# Patient Record
Sex: Female | Born: 1980 | Race: White | Hispanic: No | Marital: Single | Smoking: Current some day smoker
Health system: Southern US, Community
[De-identification: ages and names within clinical notes are randomized; demographics above are authoritative.]

## PROBLEM LIST (undated history)

## (undated) DIAGNOSIS — K611 Rectal abscess: Secondary | ICD-10-CM

## (undated) DIAGNOSIS — K6289 Other specified diseases of anus and rectum: Secondary | ICD-10-CM

## (undated) HISTORY — PX: JOINT REPLACEMENT: SHX530

## (undated) HISTORY — DX: Rectal abscess: K61.1

## (undated) HISTORY — DX: Other specified diseases of anus and rectum: K62.89

---

## 2000-02-08 ENCOUNTER — Encounter: Admission: RE | Admit: 2000-02-08 | Discharge: 2000-02-08 | Payer: Self-pay | Admitting: Family Medicine

## 2000-02-08 ENCOUNTER — Encounter: Payer: Self-pay | Admitting: Family Medicine

## 2000-03-19 ENCOUNTER — Other Ambulatory Visit: Admission: RE | Admit: 2000-03-19 | Discharge: 2000-03-19 | Payer: Self-pay | Admitting: Gynecology

## 2000-03-19 ENCOUNTER — Encounter (INDEPENDENT_AMBULATORY_CARE_PROVIDER_SITE_OTHER): Payer: Self-pay | Admitting: Specialist

## 2001-02-18 ENCOUNTER — Other Ambulatory Visit: Admission: RE | Admit: 2001-02-18 | Discharge: 2001-02-18 | Payer: Self-pay | Admitting: Gynecology

## 2001-11-25 ENCOUNTER — Other Ambulatory Visit: Admission: RE | Admit: 2001-11-25 | Discharge: 2001-11-25 | Payer: Self-pay | Admitting: Gynecology

## 2002-06-23 ENCOUNTER — Other Ambulatory Visit: Admission: RE | Admit: 2002-06-23 | Discharge: 2002-06-23 | Payer: Self-pay | Admitting: Gynecology

## 2002-10-23 ENCOUNTER — Encounter: Payer: Self-pay | Admitting: Family Medicine

## 2002-10-23 ENCOUNTER — Encounter: Admission: RE | Admit: 2002-10-23 | Discharge: 2002-10-23 | Payer: Self-pay | Admitting: Family Medicine

## 2002-12-30 ENCOUNTER — Other Ambulatory Visit: Admission: RE | Admit: 2002-12-30 | Discharge: 2002-12-30 | Payer: Self-pay | Admitting: Gynecology

## 2003-08-10 ENCOUNTER — Other Ambulatory Visit: Admission: RE | Admit: 2003-08-10 | Discharge: 2003-08-10 | Payer: Self-pay | Admitting: Gynecology

## 2004-01-05 ENCOUNTER — Encounter: Admission: RE | Admit: 2004-01-05 | Discharge: 2004-01-05 | Payer: Self-pay | Admitting: Family Medicine

## 2004-01-25 ENCOUNTER — Other Ambulatory Visit: Admission: RE | Admit: 2004-01-25 | Discharge: 2004-01-25 | Payer: Self-pay | Admitting: Gynecology

## 2004-08-02 ENCOUNTER — Encounter: Admission: RE | Admit: 2004-08-02 | Discharge: 2004-08-02 | Payer: Self-pay | Admitting: Family Medicine

## 2004-08-23 ENCOUNTER — Other Ambulatory Visit: Admission: RE | Admit: 2004-08-23 | Discharge: 2004-08-23 | Payer: Self-pay | Admitting: Gynecology

## 2005-10-31 ENCOUNTER — Other Ambulatory Visit: Admission: RE | Admit: 2005-10-31 | Discharge: 2005-10-31 | Payer: Self-pay | Admitting: Gynecology

## 2006-02-15 ENCOUNTER — Inpatient Hospital Stay (HOSPITAL_COMMUNITY): Admission: AD | Admit: 2006-02-15 | Discharge: 2006-02-15 | Payer: Self-pay | Admitting: Obstetrics and Gynecology

## 2006-02-26 ENCOUNTER — Inpatient Hospital Stay (HOSPITAL_COMMUNITY): Admission: AD | Admit: 2006-02-26 | Discharge: 2006-02-26 | Payer: Self-pay | Admitting: Obstetrics & Gynecology

## 2006-03-12 ENCOUNTER — Inpatient Hospital Stay (HOSPITAL_COMMUNITY): Admission: RE | Admit: 2006-03-12 | Discharge: 2006-03-12 | Payer: Self-pay | Admitting: Obstetrics & Gynecology

## 2006-03-22 ENCOUNTER — Inpatient Hospital Stay (HOSPITAL_COMMUNITY): Admission: AD | Admit: 2006-03-22 | Discharge: 2006-03-23 | Payer: Self-pay | Admitting: Family Medicine

## 2006-10-18 ENCOUNTER — Inpatient Hospital Stay (HOSPITAL_COMMUNITY): Admission: AD | Admit: 2006-10-18 | Discharge: 2006-10-18 | Payer: Self-pay | Admitting: Obstetrics and Gynecology

## 2006-10-26 ENCOUNTER — Inpatient Hospital Stay (HOSPITAL_COMMUNITY): Admission: AD | Admit: 2006-10-26 | Discharge: 2006-10-26 | Payer: Self-pay | Admitting: Obstetrics and Gynecology

## 2006-10-27 ENCOUNTER — Inpatient Hospital Stay (HOSPITAL_COMMUNITY): Admission: AD | Admit: 2006-10-27 | Discharge: 2006-10-29 | Payer: Self-pay | Admitting: Obstetrics and Gynecology

## 2007-06-13 ENCOUNTER — Ambulatory Visit: Payer: Self-pay | Admitting: Internal Medicine

## 2007-06-18 ENCOUNTER — Ambulatory Visit: Payer: Self-pay | Admitting: Internal Medicine

## 2007-06-19 ENCOUNTER — Ambulatory Visit: Payer: Self-pay | Admitting: *Deleted

## 2007-06-20 ENCOUNTER — Ambulatory Visit (HOSPITAL_COMMUNITY): Admission: RE | Admit: 2007-06-20 | Discharge: 2007-06-20 | Payer: Self-pay | Admitting: Family Medicine

## 2007-06-27 ENCOUNTER — Encounter: Admission: RE | Admit: 2007-06-27 | Discharge: 2007-09-25 | Payer: Self-pay | Admitting: Family Medicine

## 2007-07-02 ENCOUNTER — Encounter: Payer: Self-pay | Admitting: Family Medicine

## 2007-07-02 ENCOUNTER — Ambulatory Visit: Payer: Self-pay | Admitting: Internal Medicine

## 2007-07-02 LAB — CONVERTED CEMR LAB
Basophils Absolute: 0 10*3/uL (ref 0.0–0.1)
Eosinophils Absolute: 0.3 10*3/uL (ref 0.2–0.7)
HCT: 44.3 % (ref 36.0–46.0)
Lymphs Abs: 3 10*3/uL (ref 0.7–4.0)
Monocytes Relative: 6 % (ref 3–12)
Neutro Abs: 5.3 10*3/uL (ref 1.7–7.7)
Platelets: 240 10*3/uL (ref 150–400)
RDW: 13.5 % (ref 11.5–15.5)
WBC: 9.2 10*3/uL (ref 4.0–10.5)

## 2007-07-03 ENCOUNTER — Ambulatory Visit (HOSPITAL_COMMUNITY): Admission: RE | Admit: 2007-07-03 | Discharge: 2007-07-03 | Payer: Self-pay | Admitting: Family Medicine

## 2007-07-12 ENCOUNTER — Ambulatory Visit: Payer: Self-pay | Admitting: Internal Medicine

## 2007-08-12 ENCOUNTER — Ambulatory Visit: Payer: Self-pay | Admitting: Family Medicine

## 2007-10-31 IMAGING — US US OB COMP LESS 14 WK
1 series · 14 of 28 positions shown · non-contrast
Comparison: none

CLINICAL DATA: 6 weeks pregnant with sharp abdominal pains.  
OBSTETRICAL ULTRASOUND <14 WKS AND TRANSVAGINAL OB US:
Intrauterine gestational sac:   Yes
Yolk sac:  Yes
Embryo:  No
Heart Beat:  No

MSD:  1.2 mm    6 w 0d
No subchorionic hemorrhage:
Maternal Uterine and Adnexal findings:
Normal ovaries.  No free fluid.

[Series 1: us ob comp less 14 wk · 0.29mm/px · 14 of 48 slices shown]
[im 2/48]
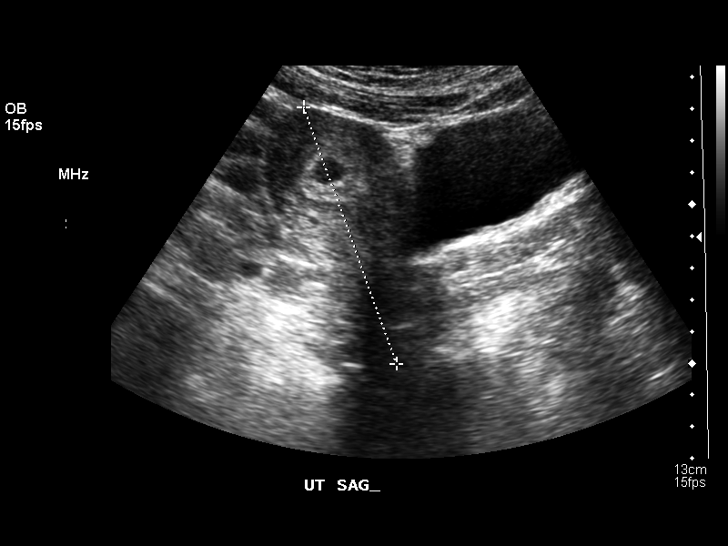
[im 6/48]
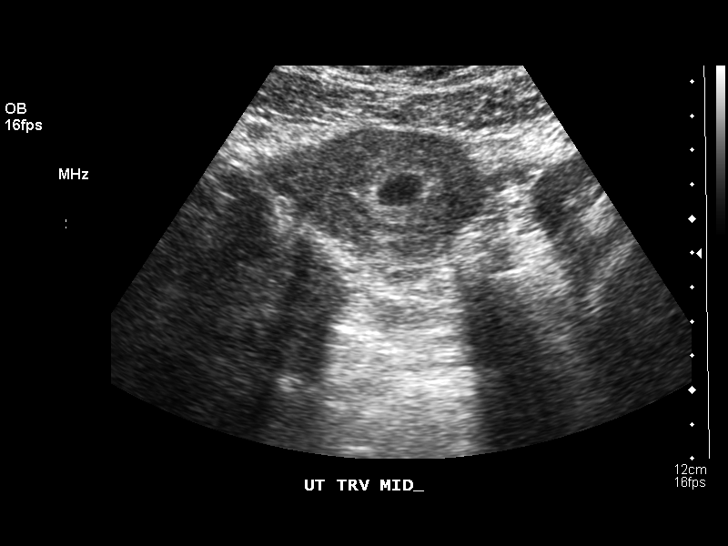
[im 9/48]
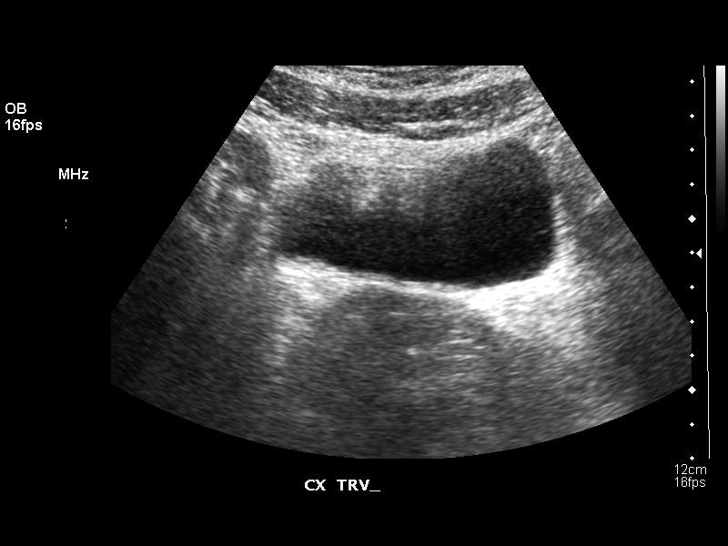
[im 13/48]
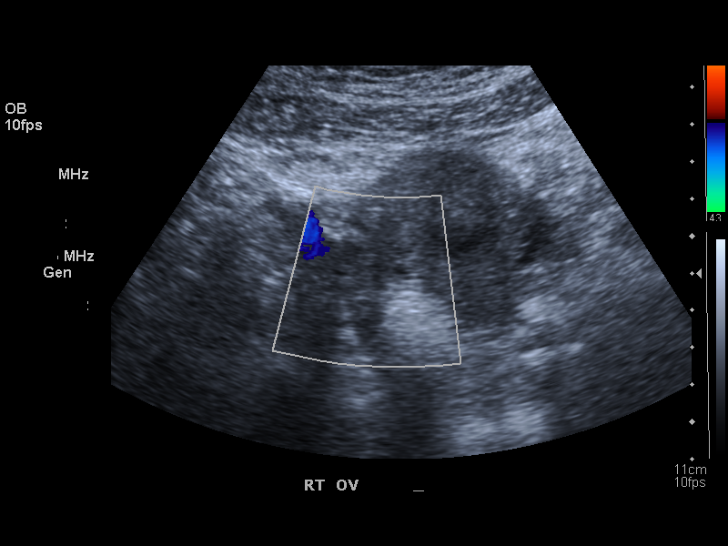
[im 16/48]
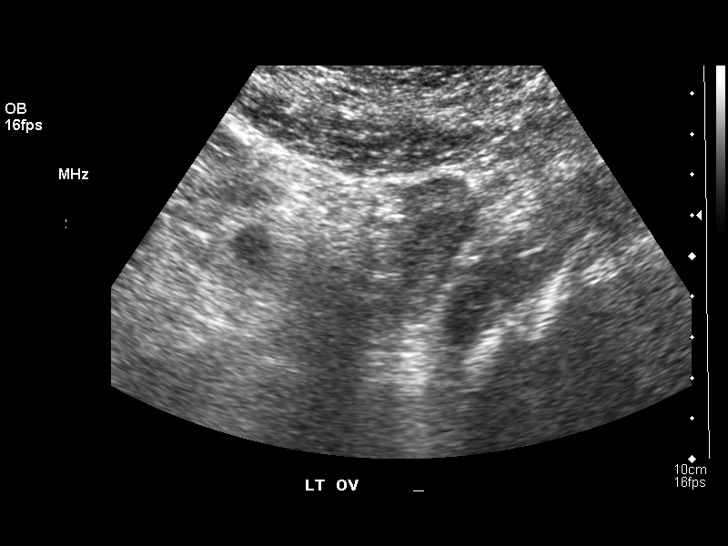
[im 20/48]
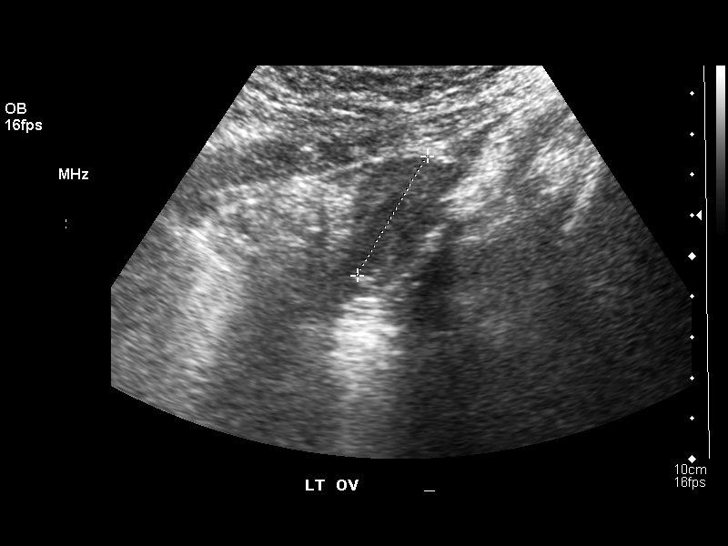
[im 23/48]
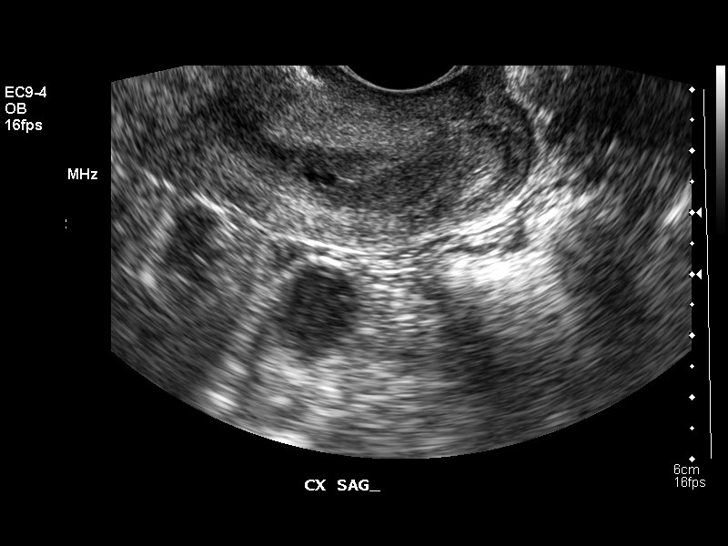
[im 27/48]
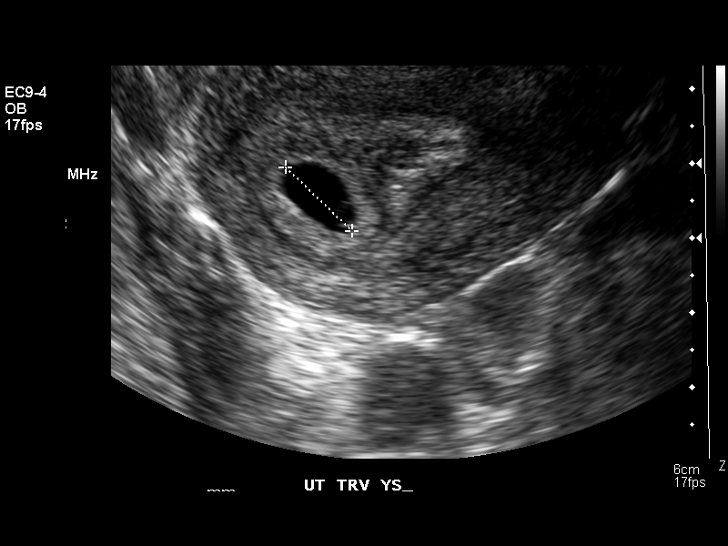
[im 30/48]
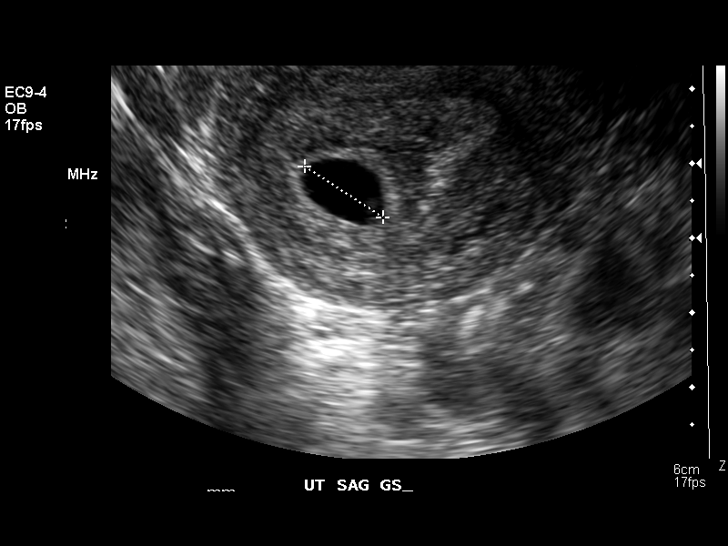
[im 34/48]
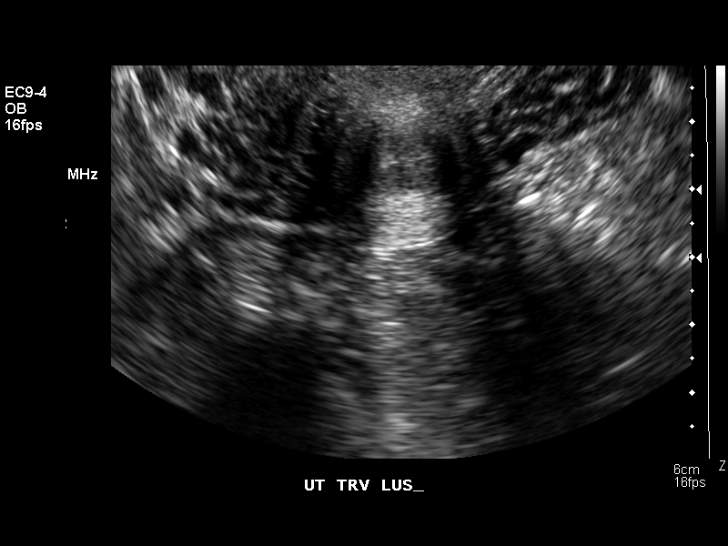
[im 37/48]
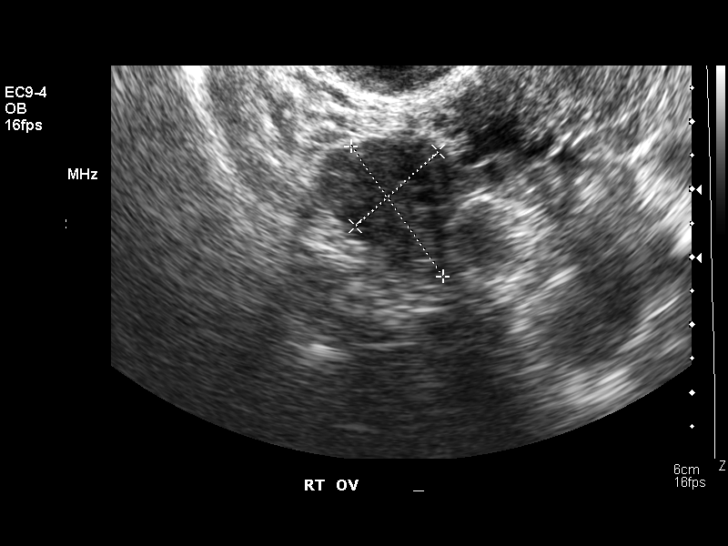
[im 41/48]
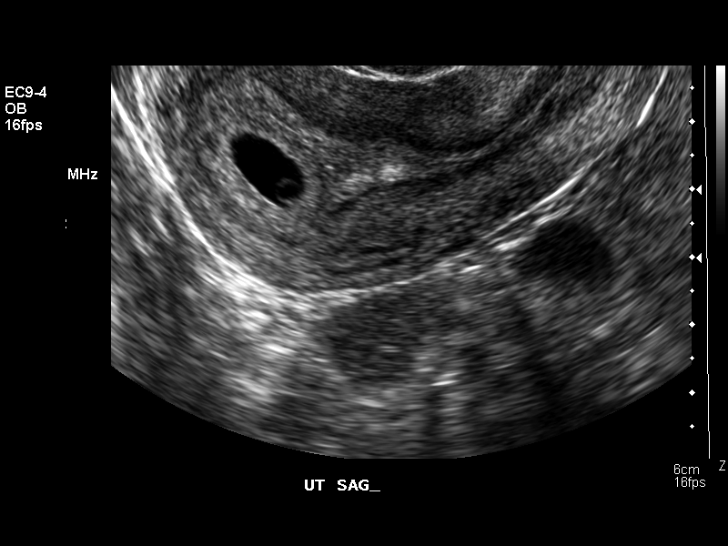
[im 44/48]
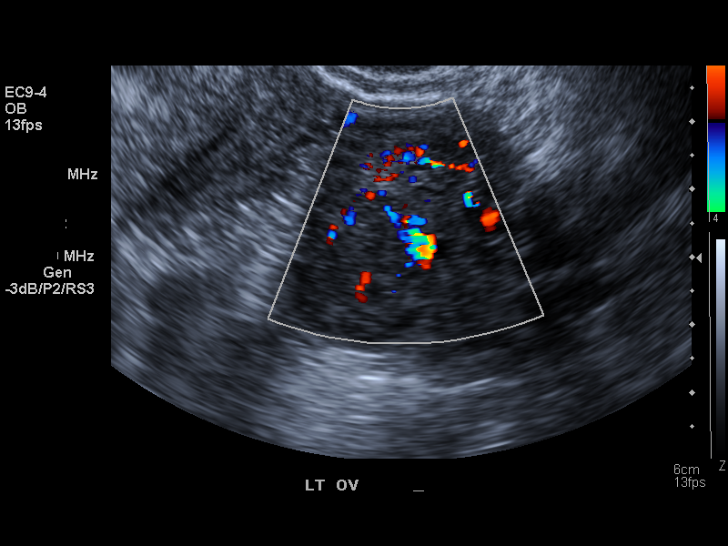
[im 48/48]
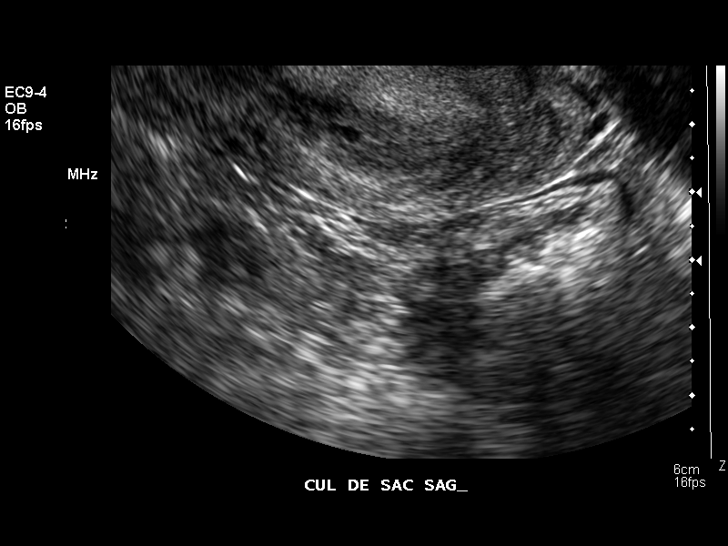

[14 of 28 positions shown; findings below may reference images not displayed]

IMPRESSION: Early intrauterine pregnancy with gestational age of 6 weeks 0 days.

## 2009-11-08 ENCOUNTER — Encounter: Admission: RE | Admit: 2009-11-08 | Discharge: 2009-12-08 | Payer: Self-pay | Admitting: Family Medicine

## 2009-11-30 ENCOUNTER — Emergency Department (HOSPITAL_COMMUNITY): Admission: EM | Admit: 2009-11-30 | Discharge: 2009-11-30 | Payer: Self-pay | Admitting: Family Medicine

## 2011-01-06 NOTE — Discharge Summary (Signed)
NAMEANGELL, PINCOCK NO.:  [id number]   MEDICAL RECORD NO.:  [id number]          PATIENT TYPE:  INP   LOCATION:  9148                          FACILITY:  [facility]   PHYSICIAN:  Huel Cote, M.D. DATE OF BIRTH:  1981/07/16   DATE OF ADMISSION:  10/27/2006  DATE OF DISCHARGE:  10/29/2006                               DISCHARGE SUMMARY   DISCHARGE DIAGNOSES:  1. Term pregnancy at 40-3/7 weeks delivered.  2. Status post normal spontaneous vaginal delivery.   DISCHARGE MEDICATIONS:  1. Motrin 600 mg p.o. every 6 hours.  2. Prenatal vitamins one p.o. daily   DISCHARGE FOLLOWUP:  The patient is to follow-up in the office in 6  weeks for her routine postpartum exam.  She does request that she get  Implanon put in at her 6-week postpartum exam and will likely make that  appointment with Dr. Lavina Hamman.   LABORATORY DATA:  Prenatal labs are as follows: O+, antibody negative,  RPR nonreactive, hepatitis B surface antigen negative, rubella immune,  GC negative, chlamydia negative, 1-hour Glucola 97, group B Strep  positive.   OBSTETRICAL HISTORY:  She had one elective abortion.  Past GYN:  History  of abnormal Pap smears which resolved.   PAST SURGICAL HISTORY:  No surgical history.   PAST MEDICAL HISTORY:  No medical history.   ALLERGIES:  No allergies.   HOSPITAL COURSE:  On admission she was afebrile with stable vital signs.  She had presented to maternity admissions with a cervical exam showing 5  cm of dilation and was admitted with spontaneous rupture of membranes  several hours later.  She then continued to progress to complete  dilation and reached complete dilation and pushed well with normal  spontaneous vaginal delivery of a viable female infant over a first-  degree laceration.  Apgar's were 8 and 9, weight was 6 pounds, 12  ounces.  There was nuchal cord times one which was reduced.  Placenta  delivered spontaneously.  A first-degree laceration  was repaired with 2-  0 Vicryl for hemostasis.  Estimated blood loss was 350  mL.  Cervix and rectum were intact.  She was then admitted for routine  postpartum care.  She did quite well and by postpartum day #2 had no  complaints.  She had normal lochia.  Her fundus was firm and she was  felt stable for discharge home.  Her discharge hemoglobin was 11.4 down  from 13.9.      Huel Cote, M.D.  Electronically Signed     KR/MEDQ  D:  10/29/2006  T:  10/29/2006  Job:  [id number]

## 2012-01-20 ENCOUNTER — Encounter (HOSPITAL_COMMUNITY): Payer: Self-pay | Admitting: Anesthesiology

## 2012-01-20 ENCOUNTER — Encounter (HOSPITAL_COMMUNITY): Admission: EM | Disposition: A | Discharge status: Home / Self Care | Payer: Self-pay | Source: Home / Self Care

## 2012-01-20 ENCOUNTER — Inpatient Hospital Stay (HOSPITAL_COMMUNITY)
Admission: EM | Admit: 2012-01-20 | Discharge: 2012-01-23 | DRG: 348 | Disposition: A | Discharge status: Home / Self Care | Payer: Medicaid Other | Attending: General Surgery | Admitting: General Surgery

## 2012-01-20 ENCOUNTER — Encounter (HOSPITAL_COMMUNITY): Payer: Self-pay

## 2012-01-20 ENCOUNTER — Emergency Department (HOSPITAL_COMMUNITY): Payer: Medicaid Other | Admitting: Anesthesiology

## 2012-01-20 ENCOUNTER — Emergency Department (HOSPITAL_COMMUNITY): Payer: Medicaid Other

## 2012-01-20 DIAGNOSIS — K612 Anorectal abscess: Secondary | ICD-10-CM

## 2012-01-20 DIAGNOSIS — D72829 Elevated white blood cell count, unspecified: Secondary | ICD-10-CM | POA: Diagnosis present

## 2012-01-20 DIAGNOSIS — K610 Anal abscess: Secondary | ICD-10-CM

## 2012-01-20 DIAGNOSIS — Z79899 Other long term (current) drug therapy: Secondary | ICD-10-CM

## 2012-01-20 DIAGNOSIS — Z6841 Body Mass Index (BMI) 40.0 and over, adult: Secondary | ICD-10-CM

## 2012-01-20 DIAGNOSIS — K611 Rectal abscess: Secondary | ICD-10-CM

## 2012-01-20 DIAGNOSIS — N83209 Unspecified ovarian cyst, unspecified side: Secondary | ICD-10-CM | POA: Diagnosis present

## 2012-01-20 HISTORY — PX: INCISION AND DRAINAGE PERIRECTAL ABSCESS: SHX1804

## 2012-01-20 LAB — POCT I-STAT, CHEM 8
BUN: 9 mg/dL (ref 6–23)
Calcium, Ion: 1.16 mmol/L (ref 1.12–1.32)
Hemoglobin: 14.3 g/dL (ref 12.0–15.0)
TCO2: 24 mmol/L (ref 0–100)

## 2012-01-20 LAB — CBC
HCT: 40.1 % (ref 36.0–46.0)
Hemoglobin: 13.8 g/dL (ref 12.0–15.0)
MCHC: 34.4 g/dL (ref 30.0–36.0)
MCV: 87 fL (ref 78.0–100.0)
RBC: 4.61 MIL/uL (ref 3.87–5.11)
RDW: 13.2 % (ref 11.5–15.5)
WBC: 16.8 10*3/uL — ABNORMAL HIGH (ref 4.0–10.5)

## 2012-01-20 LAB — URINE MICROSCOPIC-ADD ON

## 2012-01-20 LAB — URINALYSIS, ROUTINE W REFLEX MICROSCOPIC
Ketones, ur: NEGATIVE mg/dL
Leukocytes, UA: NEGATIVE
Nitrite: NEGATIVE
Protein, ur: NEGATIVE mg/dL

## 2012-01-20 LAB — POCT PREGNANCY, URINE: Preg Test, Ur: NEGATIVE

## 2012-01-20 LAB — DIFFERENTIAL
Basophils Absolute: 0 10*3/uL (ref 0.0–0.1)
Eosinophils Absolute: 0.1 10*3/uL (ref 0.0–0.7)
Monocytes Absolute: 0.9 10*3/uL (ref 0.1–1.0)
Neutro Abs: 14.4 10*3/uL — ABNORMAL HIGH (ref 1.7–7.7)

## 2012-01-20 SURGERY — INCISION AND DRAINAGE, ABSCESS, PERIRECTAL
Anesthesia: General | Site: Rectum | Wound class: Dirty or Infected

## 2012-01-20 MED ORDER — MORPHINE SULFATE 4 MG/ML IJ SOLN: 4.0000 mg | Freq: Once | INTRAMUSCULAR | Status: DC

## 2012-01-20 MED ORDER — OXYCODONE-ACETAMINOPHEN 5-325 MG PO TABS
2.0000 | ORAL_TABLET | Freq: Once | ORAL | Status: AC
  Administered 2012-01-20: 2 via ORAL
  Filled 2012-01-20: qty 2

## 2012-01-20 MED ORDER — SODIUM CHLORIDE BACTERIOSTATIC 0.9 % IJ SOLN
INTRAMUSCULAR | Status: DC | PRN
  Administered 2012-01-20: 10 mL

## 2012-01-20 MED ORDER — DEXTROSE 5 % IV SOLN
INTRAVENOUS | Status: DC | PRN
  Administered 2012-01-20: 22:00:00 via INTRAVENOUS

## 2012-01-20 MED ORDER — ONDANSETRON HCL 4 MG/2ML IJ SOLN
4.0000 mg | INTRAMUSCULAR | Status: DC | PRN
  Administered 2012-01-20: 4 mg via INTRAVENOUS
  Filled 2012-01-20: qty 2

## 2012-01-20 MED ORDER — IOHEXOL 300 MG/ML  SOLN
100.0000 mL | Freq: Once | INTRAMUSCULAR | Status: AC | PRN
  Administered 2012-01-20: 100 mL via INTRAVENOUS

## 2012-01-20 MED ORDER — NEOSTIGMINE METHYLSULFATE 1 MG/ML IJ SOLN
INTRAMUSCULAR | Status: DC | PRN
  Administered 2012-01-20: 5 mg via INTRAVENOUS

## 2012-01-20 MED ORDER — PROPOFOL 10 MG/ML IV EMUL
INTRAVENOUS | Status: DC | PRN
  Administered 2012-01-20: 170 mg via INTRAVENOUS

## 2012-01-20 MED ORDER — MORPHINE SULFATE 4 MG/ML IJ SOLN
4.0000 mg | Freq: Once | INTRAMUSCULAR | Status: AC
  Administered 2012-01-20: 4 mg via INTRAVENOUS
  Filled 2012-01-20: qty 1

## 2012-01-20 MED ORDER — SODIUM CHLORIDE 0.9 % IV BOLUS (SEPSIS)
1000.0000 mL | Freq: Once | INTRAVENOUS | Status: AC
  Administered 2012-01-20: 1000 mL via INTRAVENOUS

## 2012-01-20 MED ORDER — LIDOCAINE HCL (CARDIAC) 20 MG/ML IV SOLN
INTRAVENOUS | Status: DC | PRN
  Administered 2012-01-20: 80 mg via INTRAVENOUS

## 2012-01-20 MED ORDER — SODIUM CHLORIDE 0.9 % IV SOLN
INTRAVENOUS | Status: DC | PRN
  Administered 2012-01-20: 22:00:00 via INTRAVENOUS

## 2012-01-20 MED ORDER — LACTATED RINGERS IV SOLN
INTRAVENOUS | Status: DC | PRN
  Administered 2012-01-20: 22:00:00 via INTRAVENOUS

## 2012-01-20 MED ORDER — ONDANSETRON HCL 4 MG/2ML IJ SOLN
INTRAMUSCULAR | Status: DC | PRN
  Administered 2012-01-20: 4 mg via INTRAVENOUS

## 2012-01-20 MED ORDER — 0.9 % SODIUM CHLORIDE (POUR BTL) OPTIME
TOPICAL | Status: DC | PRN
  Administered 2012-01-20: 1000 mL

## 2012-01-20 MED ORDER — SUCCINYLCHOLINE CHLORIDE 20 MG/ML IJ SOLN
INTRAMUSCULAR | Status: DC | PRN
  Administered 2012-01-20: 100 mg via INTRAVENOUS

## 2012-01-20 MED ORDER — GLYCOPYRROLATE 0.2 MG/ML IJ SOLN
INTRAMUSCULAR | Status: DC | PRN
  Administered 2012-01-20: .6 mg via INTRAVENOUS

## 2012-01-20 MED ORDER — FENTANYL CITRATE 0.05 MG/ML IJ SOLN
INTRAMUSCULAR | Status: DC | PRN
  Administered 2012-01-20 (×2): 100 ug via INTRAVENOUS

## 2012-01-20 MED ORDER — CEFAZOLIN SODIUM 1-5 GM-% IV SOLN
INTRAVENOUS | Status: DC | PRN
  Administered 2012-01-20: 2 g via INTRAVENOUS

## 2012-01-20 MED ORDER — ONDANSETRON HCL 4 MG/2ML IJ SOLN: 4.0000 mg | INTRAMUSCULAR | Status: DC | PRN

## 2012-01-20 MED ORDER — DROPERIDOL 2.5 MG/ML IJ SOLN
INTRAMUSCULAR | Status: DC | PRN
  Administered 2012-01-20: 0.625 mg via INTRAVENOUS

## 2012-01-20 MED ORDER — MIDAZOLAM HCL 5 MG/5ML IJ SOLN
INTRAMUSCULAR | Status: DC | PRN
  Administered 2012-01-20: 2 mg via INTRAVENOUS

## 2012-01-20 MED ORDER — DEXAMETHASONE SODIUM PHOSPHATE 4 MG/ML IJ SOLN
INTRAMUSCULAR | Status: DC | PRN
  Administered 2012-01-20: 4 mg via INTRAVENOUS

## 2012-01-20 MED ORDER — VECURONIUM BROMIDE 10 MG IV SOLR
INTRAVENOUS | Status: DC | PRN
  Administered 2012-01-20: 3 mg via INTRAVENOUS

## 2012-01-20 MED ORDER — METOCLOPRAMIDE HCL 5 MG/ML IJ SOLN
INTRAMUSCULAR | Status: DC | PRN
  Administered 2012-01-20: 10 mg via INTRAVENOUS

## 2012-01-20 MED ORDER — OXYCODONE-ACETAMINOPHEN 5-325 MG PO TABS
1.0000 | ORAL_TABLET | Freq: Once | ORAL | Status: AC
  Administered 2012-01-20: 1 via ORAL
  Filled 2012-01-20: qty 1

## 2012-01-20 SURGICAL SUPPLY — 42 items
BLADE SURG 15 STRL LF DISP TIS (BLADE) ×1 IMPLANT
BLADE SURG 15 STRL SS (BLADE) ×2
CANISTER SUCTION 2500CC (MISCELLANEOUS) ×2 IMPLANT
CLEANER TIP ELECTROSURG 2X2 (MISCELLANEOUS) IMPLANT
CLOTH BEACON ORANGE TIMEOUT ST (SAFETY) ×2 IMPLANT
CONT SPEC STER OR (MISCELLANEOUS) ×2 IMPLANT
COVER SURGICAL LIGHT HANDLE (MISCELLANEOUS) ×2 IMPLANT
DRAPE PROXIMA HALF (DRAPES) ×2 IMPLANT
DRAPE UTILITY 15X26 W/TAPE STR (DRAPE) ×2 IMPLANT
DRSG PAD ABDOMINAL 8X10 ST (GAUZE/BANDAGES/DRESSINGS) ×2 IMPLANT
ELECT CAUTERY BLADE 6.4 (BLADE) ×1 IMPLANT
ELECT REM PT RETURN 9FT ADLT (ELECTROSURGICAL) ×2
ELECTRODE REM PT RTRN 9FT ADLT (ELECTROSURGICAL) IMPLANT
GAUZE PACKING IODOFORM 1 (PACKING) IMPLANT
GAUZE PACKING IODOFORM 1/4X5 (PACKING) ×1 IMPLANT
GAUZE SPONGE 4X4 16PLY XRAY LF (GAUZE/BANDAGES/DRESSINGS) ×2 IMPLANT
GEL ULTRASOUND 20GR AQUASONIC (MISCELLANEOUS) ×2 IMPLANT
GLOVE BIOGEL M STRL SZ7.5 (GLOVE) ×2 IMPLANT
GLOVE BIOGEL PI IND STRL 8 (GLOVE) ×2 IMPLANT
GLOVE BIOGEL PI INDICATOR 8 (GLOVE) ×2
GOWN STRL NON-REIN LRG LVL3 (GOWN DISPOSABLE) ×2 IMPLANT
GOWN STRL REIN XL XLG (GOWN DISPOSABLE) ×2 IMPLANT
KIT BASIN OR (CUSTOM PROCEDURE TRAY) ×2 IMPLANT
KIT ROOM TURNOVER OR (KITS) ×4 IMPLANT
LOOP VESSEL MAXI BLUE (MISCELLANEOUS) ×1 IMPLANT
NS IRRIG 1000ML POUR BTL (IV SOLUTION) ×2 IMPLANT
PACK LITHOTOMY IV (CUSTOM PROCEDURE TRAY) ×2 IMPLANT
PAD ARMBOARD 7.5X6 YLW CONV (MISCELLANEOUS) ×4 IMPLANT
PENCIL BUTTON HOLSTER BLD 10FT (ELECTRODE) ×1 IMPLANT
SPONGE GAUZE 4X4 12PLY (GAUZE/BANDAGES/DRESSINGS) ×3 IMPLANT
SPONGE LAP 18X18 X RAY DECT (DISPOSABLE) ×1 IMPLANT
SUT SILK 2 0 FS (SUTURE) ×1 IMPLANT
SWAB COLLECTION DEVICE MRSA (MISCELLANEOUS) ×2 IMPLANT
SYR BULB IRRIGATION 50ML (SYRINGE) ×1 IMPLANT
TAPE CLOTH SURG 4X10 WHT LF (GAUZE/BANDAGES/DRESSINGS) ×1 IMPLANT
TOWEL OR 17X24 6PK STRL BLUE (TOWEL DISPOSABLE) ×2 IMPLANT
TOWEL OR 17X26 10 PK STRL BLUE (TOWEL DISPOSABLE) ×2 IMPLANT
TUBE ANAEROBIC SPECIMEN COL (MISCELLANEOUS) ×2 IMPLANT
TUBE CONNECTING 12X1/4 (SUCTIONS) ×2 IMPLANT
UNDERPAD 30X30 INCONTINENT (UNDERPADS AND DIAPERS) ×2 IMPLANT
WATER STERILE IRR 1000ML POUR (IV SOLUTION) ×1 IMPLANT
YANKAUER SUCT BULB TIP NO VENT (SUCTIONS) ×2 IMPLANT

## 2012-01-20 NOTE — ED Notes (Signed)
amb to bathroom to obtain urine specimen

## 2012-01-20 NOTE — ED Notes (Signed)
Admitting MD at bedside.

## 2012-01-20 NOTE — ED Provider Notes (Signed)
History     CSN: [id number]  Arrival date & time 01/20/12  1526   First MD Initiated Contact with Patient 01/20/12 1540      Chief Complaint  Patient presents with  . Rectal Bleeding  . Hemorrhoids    (Consider location/radiation/quality/duration/timing/severity/associated sxs/prior treatment) HPI Comments: Patient is a 31 year old female with no significant past medical history and the chief complaint of perirectal pain.  Patient was evaluated by urgent care or yesterday and diagnosed with hemorrhoids and rectal bleeding.  Patient states since that appointment her pain has gotten gradually worse and she is concerned.  Pain worsened by anal wiping and sitting, however not with defecation.  Patient denies any purulent drainage.  She states she has had fevers running up to 100.3 and she's been taking ibuprofen and Tylenol for pain.  Patient's last meal was at 11 a.m. which she forced down because she has had a decreased appetite due to pain.  Associated symptoms include nausea and vomiting times one from pain.  Patient did have night sweats and chills last evening but denies any other associated symptoms including diarrhea, constipation urinary frequency, dysuria, vaginal discharge, or rectal discharge.  Patient does not have a history of diabetes, IV drug use, Crohn's, ulcerative colitis, or steroid use.  Patient has no other complaints at this time.  Patient is a 31 y.o. female presenting with hematochezia. The history is provided by the patient.  Rectal Bleeding     History reviewed. No pertinent past medical history.  History reviewed. No pertinent past surgical history.  History reviewed. No pertinent family history.  History  Substance Use Topics  . Smoking status: Not on file  . Smokeless tobacco: Not on file  . Alcohol Use: Not on file    OB History    Grav Para Term Preterm Abortions TAB SAB Ect Mult Living                  Review of Systems  Gastrointestinal:  Positive for hematochezia.  All other systems reviewed and are negative.    Allergies  Review of patient's allergies indicates no known allergies.  Home Medications   Current Outpatient Rx  Name Route Sig Dispense Refill  . HYDROCORTISONE 2.5 % RE CREA Rectal Place 1 application rectally 2 (two) times daily.    Marland Kitchen HYDROCORTISONE ACETATE 25 MG RE SUPP Rectal Place 25 mg rectally 2 (two) times daily.    . IBUPROFEN 200 MG PO TABS Oral Take 800 mg by mouth once.    . VENLAFAXINE HCL ER 75 MG PO CP24 Oral Take 225 mg by mouth daily.      BP 120/79  Pulse 84  Temp(Src) 98.8 F (37.1 C) (Oral)  Resp 22  SpO2 96%  LMP 12/22/2011  Physical Exam  Nursing note and vitals reviewed. Constitutional: She is oriented to person, place, and time. She appears well-developed and well-nourished. No distress.  HENT:  Head: Normocephalic and atraumatic.  Eyes: Conjunctivae and EOM are normal.  Neck: Normal range of motion.  Cardiovascular:       Tachycardic  Pulmonary/Chest: Effort normal.  Abdominal:       No abdominal tenderness to palpation.  Bowel sounds present and normal.  Genitourinary:       Perirectal fluctuance palpated.  No evidence of drainage.  Digital rectal exam deferred due to pain.  Evidence of nontender external hemorrhoid versus large skin tag.  Musculoskeletal: Normal range of motion.  Neurological: She is alert and oriented to person,  place, and time.  Skin: Skin is warm and dry. No rash noted. She is not diaphoretic.  Psychiatric: She has a normal mood and affect. Her behavior is normal.    ED Course  Procedures (including critical care time)  Labs Reviewed  CBC - Abnormal; Notable for the following:    WBC 16.8 (*)    All other components within normal limits  DIFFERENTIAL - Abnormal; Notable for the following:    Neutrophils Relative 86 (*)    Neutro Abs 14.4 (*)    Lymphocytes Relative 8 (*)    All other components within normal limits  POCT I-STAT, CHEM 8  - Abnormal; Notable for the following:    Glucose, Bld 114 (*)    All other components within normal limits  URINALYSIS, ROUTINE W REFLEX MICROSCOPIC - Abnormal; Notable for the following:    Hgb urine dipstick SMALL (*)    All other components within normal limits  URINE MICROSCOPIC-ADD ON - Abnormal; Notable for the following:    Squamous Epithelial / LPF FEW (*)    All other components within normal limits  LACTIC ACID, PLASMA  POCT PREGNANCY, URINE   Ct Pelvis W Contrast  01/20/2012  *RADIOLOGY REPORT*  Clinical Data:  Rectal pain and bleeding.  Fever.  CT PELVIS WITH CONTRAST  Technique:  Multidetector CT imaging of the pelvis was performed using the standard protocol following the bolus administration of intravenous contrast.  Contrast: OMNIPAQUE IOHEXOL 300 MG/ML  SOLN  Comparison:  Ultrasound dated 07/03/2007.  Findings:  Mild concentric inferior rectal wall thickening.  On the most inferior images, there is a partially included low density mass-like area to the left and anterior to the inferior rectum, measuring 3.0 x 2.6 cm one image number 54.  3.4 cm right ovarian cyst, not seen on the previous ultrasound.  No enlarged lymph nodes.  Unremarkable bones.  IMPRESSION:  1.  Mild diffuse inferior rectal wall thickening with a probable small left perirectal/perianal abscess, not included in its entirety.  The included portion measures 3.0 cm in maximum diameter. 2.  Interval 3.0 cm right ovarian cyst.  Original Report Authenticated By: Darrol Angel, M.D.     No diagnosis found.    MDM  Patient with perirectal/perianal abscess, elevated white count with shift to the left and tachycardic on arrival.  Patient to be admitted to general surgery.  He has pain it has been managed in the emergency department and she has no current complaints.        Jaci Carrel, [place] 01/20/12 2032

## 2012-01-20 NOTE — Anesthesia Postprocedure Evaluation (Signed)
  Anesthesia Post-op Note  Patient: Jessica Downs  Procedure(s) Performed: Procedure(s) (LRB): IRRIGATION AND DEBRIDEMENT PERIRECTAL ABSCESS (N/A)  Patient Location: PACU  Anesthesia Type: General  Level of Consciousness: awake  Airway and Oxygen Therapy: Patient Spontanous Breathing  Post-op Pain: mild  Post-op Assessment: Post-op Vital signs reviewed  Post-op Vital Signs: Reviewed  Complications: No apparent anesthesia complications

## 2012-01-20 NOTE — ED Notes (Signed)
Family at bedside. 

## 2012-01-20 NOTE — ED Notes (Signed)
OR called stating that they are ready for patient; preparing patient for transport. 

## 2012-01-20 NOTE — Anesthesia Preprocedure Evaluation (Addendum)
Anesthesia Evaluation  Patient identified by MRN, date of birth, ID band Patient awake    Reviewed: Allergy & Precautions, H&P , NPO status , Patient's Chart, lab work & pertinent test results  Airway Mallampati: II      Dental   Pulmonary neg pulmonary ROS,  breath sounds clear to auscultation        Cardiovascular negative cardio ROS  Rhythm:Regular Rate:Normal     Neuro/Psych negative neurological ROS     GI/Hepatic negative GI ROS, Neg liver ROS,   Endo/Other  negative endocrine ROS  Renal/GU negative Renal ROS     Musculoskeletal negative musculoskeletal ROS (+)   Abdominal   Peds  Hematology negative hematology ROS (+)   Anesthesia Other Findings   Reproductive/Obstetrics                           Anesthesia Physical Anesthesia Plan  ASA: I  Anesthesia Plan: General   Post-op Pain Management:    Induction: Intravenous  Airway Management Planned: Oral ETT  Additional Equipment:   Intra-op Plan:   Post-operative Plan: Extubation in OR  Informed Consent:   Plan Discussed with: CRNA  Anesthesia Plan Comments:        Anesthesia Quick Evaluation

## 2012-01-20 NOTE — Brief Op Note (Addendum)
01/20/2012  10:35 PM  PATIENT:  Marylynn Pearson  31 y.o. female  PRE-OPERATIVE DIAGNOSIS: left  Perirectal abscess   POST-OPERATIVE DIAGNOSIS:  Left anterior ischiorectal abscess  PROCEDURE:  Procedure(s) (LRB): EXAM UNDER ANESTHESIA INCISION AND DRAINAGE of left anterior ischiorectal ABSCESS (N/A) Placement of vessel loop thru abscess cavity  SURGEON:  Surgeon(s) and Role:    * Atilano Ina, MD,FACS - Primary  PHYSICIAN ASSISTANT: none  ASSISTANTS: none   ANESTHESIA:   general  EBL:  Total I/O In: 700 [I.V.:700] Out: 50 [Blood:50]  BLOOD ADMINISTERED:none  DRAINS: none   LOCAL MEDICATIONS USED:  NONE  SPECIMEN:  Source of Specimen:  ischiorectal abscess  DISPOSITION OF SPECIMEN:  micro  COUNTS:  YES  TOURNIQUET:  * No tourniquets in log *  DICTATION: .Other Dictation: Dictation Number [id number]  PLAN OF CARE: Admit to inpatient   PATIENT DISPOSITION:  PACU - hemodynamically stable.   Delay start of Pharmacological VTE agent (>24hrs) due to surgical blood loss or risk of bleeding: no  Mary Sella. Andrey Campanile, MD, FACS General, Bariatric, & Minimally Invasive Surgery [facility], [place]

## 2012-01-20 NOTE — ED Notes (Signed)
Patient back from CT; currently sitting up in bed; no respiratory or acute distress noted.  PA currently at bedside. Patient requesting Percocet rather than Morphine; PA aware.  Patient has no other questions or concerns at this time.  Family present at bedside; will continue to monitor.

## 2012-01-20 NOTE — ED Notes (Signed)
Assisted patient to restroom.

## 2012-01-20 NOTE — Transfer of Care (Signed)
Immediate Anesthesia Transfer of Care Note  Patient: Jessica Downs  Procedure(s) Performed: Procedure(s) (LRB): IRRIGATION AND DEBRIDEMENT PERIRECTAL ABSCESS (N/A)  Patient Location: PACU  Anesthesia Type: General  Level of Consciousness: oriented, sedated, patient cooperative and responds to stimulation  Airway & Oxygen Therapy: Patient Spontanous Breathing and Patient connected to nasal cannula oxygen  Post-op Assessment: Report given to PACU RN, Post -op Vital signs reviewed and stable, Patient moving all extremities and Patient moving all extremities X 4  Post vital signs: Reviewed and stable  Complications: No apparent anesthesia complications

## 2012-01-20 NOTE — ED Notes (Signed)
Received bedside report from [place], [place].  Patient currently in CT; will continue to monitor.

## 2012-01-20 NOTE — H&P (Signed)
Jessica Downs is an 31 y.o. female.   Chief Complaint: butt pain HPI: 31 year old otherwise healthy Caucasian female started developing perianal pain on Wednesday morning. At first the pain was not that severe; however, the pain has worsened over the past several days. She reports a temperature up to 100.3. She also reports chills and sweats. She states that today the pain became quite severe prompting her to come to the emergency room. She went to an urgent care facility yesterday because of the pain and was told that she had an external hemorrhoid. She denies any previous symptoms. She had a bowel movement earlier today which was normal. She denies any recent antibiotic usage. She denies any history of inflammatory bowel disease such as Crohn's or ulcerative colitis. She denies any diarrhea, constipation, melena or hematochezia. She did take a laxative yesterday. She denies any pain with defecation. She denies any dysuria. She denies any weight loss.  History reviewed. No pertinent past medical history.  History reviewed. No pertinent past surgical history.  History reviewed. No pertinent family history. Social History:  reports that she has been smoking.  She does not have any smokeless tobacco history on file. She reports that she drinks alcohol. She reports that she does not use illicit drugs.  Allergies: No Known Allergies   (Not in a hospital admission)  Results for orders placed during the hospital encounter of 01/20/12 (from the past 48 hour(s))  CBC     Status: Abnormal   Collection Time   01/20/12  4:20 PM      Component Value Range Comment   WBC 16.8 (*) 4.0 - 10.5 (K/uL)    RBC 4.61  3.87 - 5.11 (MIL/uL)    Hemoglobin 13.8  12.0 - 15.0 (g/dL)    HCT 16.1  09.6 - 04.5 (%)    MCV 87.0  78.0 - 100.0 (fL)    MCH 29.9  26.0 - 34.0 (pg)    MCHC 34.4  30.0 - 36.0 (g/dL)    RDW 40.9  81.1 - 91.4 (%)    Platelets 221  150 - 400 (K/uL)   DIFFERENTIAL     Status: Abnormal   Collection Time   01/20/12  4:20 PM      Component Value Range Comment   Neutrophils Relative 86 (*) 43 - 77 (%)    Neutro Abs 14.4 (*) 1.7 - 7.7 (K/uL)    Lymphocytes Relative 8 (*) 12 - 46 (%)    Lymphs Abs 1.4  0.7 - 4.0 (K/uL)    Monocytes Relative 5  3 - 12 (%)    Monocytes Absolute 0.9  0.1 - 1.0 (K/uL)    Eosinophils Relative 1  0 - 5 (%)    Eosinophils Absolute 0.1  0.0 - 0.7 (K/uL)    Basophils Relative 0  0 - 1 (%)    Basophils Absolute 0.0  0.0 - 0.1 (K/uL)   POCT I-STAT, CHEM 8     Status: Abnormal   Collection Time   01/20/12  4:47 PM      Component Value Range Comment   Sodium 137  135 - 145 (mEq/L)    Potassium 3.7  3.5 - 5.1 (mEq/L)    Chloride 101  96 - 112 (mEq/L)    BUN 9  6 - 23 (mg/dL)    Creatinine, Ser 7.82  0.50 - 1.10 (mg/dL)    Glucose, Bld 956 (*) 70 - 99 (mg/dL)    Calcium, Ion 2.13  1.12 -  1.32 (mmol/L)    TCO2 24  0 - 100 (mmol/L)    Hemoglobin 14.3  12.0 - 15.0 (g/dL)    HCT 16.1  09.6 - 04.5 (%)   LACTIC ACID, PLASMA     Status: Normal   Collection Time   01/20/12  5:19 PM      Component Value Range Comment   Lactic Acid, Venous 1.2  0.5 - 2.2 (mmol/L)   URINALYSIS, ROUTINE W REFLEX MICROSCOPIC     Status: Abnormal   Collection Time   01/20/12  6:07 PM      Component Value Range Comment   Color, Urine YELLOW  YELLOW     APPearance CLEAR  CLEAR     Specific Gravity, Urine 1.006  1.005 - 1.030     pH 6.0  5.0 - 8.0     Glucose, UA NEGATIVE  NEGATIVE (mg/dL)    Hgb urine dipstick SMALL (*) NEGATIVE     Bilirubin Urine NEGATIVE  NEGATIVE     Ketones, ur NEGATIVE  NEGATIVE (mg/dL)    Protein, ur NEGATIVE  NEGATIVE (mg/dL)    Urobilinogen, UA 0.2  0.0 - 1.0 (mg/dL)    Nitrite NEGATIVE  NEGATIVE     Leukocytes, UA NEGATIVE  NEGATIVE    URINE MICROSCOPIC-ADD ON     Status: Abnormal   Collection Time   01/20/12  6:07 PM      Component Value Range Comment   Squamous Epithelial / LPF FEW (*) RARE     RBC / HPF 0-2  <3 (RBC/hpf)   POCT PREGNANCY, URINE      Status: Normal   Collection Time   01/20/12  6:16 PM      Component Value Range Comment   Preg Test, Ur NEGATIVE  NEGATIVE     RADIOLOGICAL STUDIES: I have personally reviewed the radiological exams myself  Ct Pelvis W Contrast  01/20/2012  *RADIOLOGY REPORT*  Clinical Data:  Rectal pain and bleeding.  Fever.  CT PELVIS WITH CONTRAST  Technique:  Multidetector CT imaging of the pelvis was performed using the standard protocol following the bolus administration of intravenous contrast.  Contrast: OMNIPAQUE IOHEXOL 300 MG/ML  SOLN  Comparison:  Ultrasound dated 07/03/2007.  Findings:  Mild concentric inferior rectal wall thickening.  On the most inferior images, there is a partially included low density mass-like area to the left and anterior to the inferior rectum, measuring 3.0 x 2.6 cm one image number 54.  3.4 cm right ovarian cyst, not seen on the previous ultrasound.  No enlarged lymph nodes.  Unremarkable bones.  IMPRESSION:  1.  Mild diffuse inferior rectal wall thickening with a probable small left perirectal/perianal abscess, not included in its entirety.  The included portion measures 3.0 cm in maximum diameter. 2.  Interval 3.0 cm right ovarian cyst.  Original Report Authenticated By: Darrol Angel, M.D.    Review of Systems  Constitutional: Positive for fever and chills. Negative for weight loss and diaphoresis.  HENT: Negative for hearing loss.   Eyes: Negative for blurred vision and double vision.  Respiratory: Negative for shortness of breath.   Cardiovascular: Negative for chest pain, orthopnea and leg swelling.  Gastrointestinal: Negative for nausea, vomiting and abdominal pain.  Genitourinary: Negative for dysuria, urgency and flank pain.  Musculoskeletal: Negative for myalgias.  Neurological: Negative for focal weakness, seizures, loss of consciousness and weakness.  Endo/Heme/Allergies: Does not bruise/bleed easily.  Psychiatric/Behavioral: Negative for suicidal  ideas.    Blood pressure  98/61, pulse 80, temperature 97.5 F (36.4 C), temperature source Oral, resp. rate 18, last menstrual period 12/22/2011, SpO2 98.00%. Physical Exam  Vitals reviewed. Constitutional: She is oriented to person, place, and time. She appears well-developed and well-nourished. No distress.       Morbidly obese  HENT:  Head: Normocephalic and atraumatic.  Right Ear: External ear normal.  Left Ear: External ear normal.  Eyes: Conjunctivae are normal. No scleral icterus.  Neck: Normal range of motion. Neck supple. No tracheal deviation present.  Cardiovascular: Normal rate, normal heart sounds and intact distal pulses.   Respiratory: Effort normal and breath sounds normal. No respiratory distress. She has no wheezes.  GI: Soft. Bowel sounds are normal. She exhibits no distension. There is no tenderness.  Genitourinary:       Left anterior perianal/perirectal pain, TTP, induration/fluctance of about 4cm; DRE deferred. Nonthrombosed ext hemorrhoid rt side.  Neurological: She is alert and oriented to person, place, and time. She exhibits normal muscle tone.  Skin: Skin is warm and dry. She is not diaphoretic.  Psychiatric: She has a normal mood and affect. Her behavior is normal. Judgment and thought content normal.    A chaperone was present.  Assessment/Plan Left perirectal abscess Right Ovarian cyst Leukocytosis  She has a fairly large perirectal abscess that would need operative management. We discussed the etiology and management of perirectal abscesses. I have recommended going to the operating room for incision and drainage. She will also need antibiotic treatment. She will need to have outpatient followup for her ovarian cyst.  We discussed the risk and benefits of surgery including but not limited to bleeding, worsening infection, blood clot formation, anesthesia complications, injury to surrounding structures such as the sphincter muscle, urinary retention,  fistula formation, potential need for additional procedures, and the typical postoperative recovery course. We did about the possibility of leaving a temporary drain such as a seton. I did explain that the wound would be left open to heal by secondary intention.  The patient has elected to proceed to the operating room. All of her questions were asked and answered.  Mary Sella. Andrey Campanile, MD, FACS General, Bariatric, & Minimally Invasive Surgery [facility], [place]   [facility] M 01/20/2012, 9:25 PM

## 2012-01-20 NOTE — ED Notes (Signed)
Pt complains of rectal pain, seen MD yesterday and told it was hemmorrhoids but was told to follow up with surgeon for same, pt sts no change in symptoms.now with fever.

## 2012-01-20 NOTE — Preoperative (Signed)
Beta Blockers   Reason not to administer Beta Blockers:Not Applicable 

## 2012-01-20 NOTE — ED Notes (Signed)
Patient currently resting quietly in bed; no respiratory or acute distress noted.  Patient updated on plan of care; informed patient that PA has made consult to general surgery.  PA at bedside.  Patient has no other questions or concerns at this time; will continue to monitor.

## 2012-01-20 NOTE — Anesthesia Procedure Notes (Signed)
Procedure Name: Intubation Date/Time: 01/20/2012 10:04 PM Performed by: Wray Kearns A Pre-anesthesia Checklist: Patient identified, Timeout performed, Emergency Drugs available, Suction available and Patient being monitored Patient Re-evaluated:Patient Re-evaluated prior to inductionOxygen Delivery Method: Circle system utilized Preoxygenation: Pre-oxygenation with 100% oxygen Intubation Type: IV induction, Rapid sequence and Cricoid Pressure applied Ventilation: Mask ventilation without difficulty Laryngoscope Size: Miller and 2 Grade View: Grade I Tube type: Oral Tube size: 7.0 mm Number of attempts: 1 Airway Equipment and Method: Stylet Placement Confirmation: ETT inserted through vocal cords under direct vision,  breath sounds checked- equal and bilateral and positive ETCO2 Secured at: 21 cm Tube secured with: Tape Dental Injury: Teeth and Oropharynx as per pre-operative assessment

## 2012-01-21 LAB — BASIC METABOLIC PANEL
CO2: 22 mEq/L (ref 19–32)
Calcium: 8.7 mg/dL (ref 8.4–10.5)
Creatinine, Ser: 0.6 mg/dL (ref 0.50–1.10)
GFR calc non Af Amer: >90 mL/min (ref >90)

## 2012-01-21 LAB — CBC
MCV: 86.8 fL (ref 78.0–100.0)
Platelets: 215 10*3/uL (ref 150–400)
RDW: 13.2 % (ref 11.5–15.5)
WBC: 15.8 10*3/uL — ABNORMAL HIGH (ref 4.0–10.5)

## 2012-01-21 MED ORDER — ONDANSETRON HCL 4 MG/2ML IJ SOLN: 4.0000 mg | Freq: Four times a day (QID) | INTRAMUSCULAR | Status: DC | PRN

## 2012-01-21 MED ORDER — ONDANSETRON HCL 4 MG PO TABS: 4.0000 mg | ORAL_TABLET | Freq: Four times a day (QID) | ORAL | Status: DC | PRN

## 2012-01-21 MED ORDER — VANCOMYCIN HCL IN DEXTROSE 1-5 GM/200ML-% IV SOLN
1000.0000 mg | Freq: Three times a day (TID) | INTRAVENOUS | Status: DC
  Administered 2012-01-21 – 2012-01-23 (×7): 1000 mg via INTRAVENOUS
  Filled 2012-01-21 (×9): qty 200

## 2012-01-21 MED ORDER — OXYCODONE-ACETAMINOPHEN 5-325 MG PO TABS
1.0000 | ORAL_TABLET | ORAL | Status: DC | PRN
  Administered 2012-01-21 – 2012-01-22 (×3): 2 via ORAL
  Filled 2012-01-21 (×3): qty 2

## 2012-01-21 MED ORDER — ENOXAPARIN SODIUM 40 MG/0.4ML ~~LOC~~ SOLN
40.0000 mg | SUBCUTANEOUS | Status: DC
  Administered 2012-01-21: 40 mg via SUBCUTANEOUS
  Filled 2012-01-21 (×3): qty 0.4

## 2012-01-21 MED ORDER — PIPERACILLIN-TAZOBACTAM 3.375 G IVPB
3.3750 g | Freq: Three times a day (TID) | INTRAVENOUS | Status: DC
  Administered 2012-01-21 – 2012-01-23 (×7): 3.375 g via INTRAVENOUS
  Filled 2012-01-21 (×9): qty 50

## 2012-01-21 MED ORDER — VENLAFAXINE HCL ER 75 MG PO CP24
225.0000 mg | ORAL_CAPSULE | Freq: Every day | ORAL | Status: DC
  Administered 2012-01-21 – 2012-01-22 (×2): 225 mg via ORAL
  Filled 2012-01-21 (×3): qty 1

## 2012-01-21 MED ORDER — POLYETHYLENE GLYCOL 3350 17 G PO PACK
17.0000 g | PACK | Freq: Every day | ORAL | Status: DC
  Administered 2012-01-21 – 2012-01-22 (×2): 17 g via ORAL
  Filled 2012-01-21 (×2): qty 1

## 2012-01-21 MED ORDER — MORPHINE SULFATE 2 MG/ML IJ SOLN: 1.0000 mg | INTRAMUSCULAR | Status: DC | PRN

## 2012-01-21 MED ORDER — DOCUSATE SODIUM 100 MG PO CAPS
100.0000 mg | ORAL_CAPSULE | Freq: Two times a day (BID) | ORAL | Status: DC
  Administered 2012-01-21 – 2012-01-22 (×4): 100 mg via ORAL
  Filled 2012-01-21 (×4): qty 1

## 2012-01-21 MED ORDER — KCL IN DEXTROSE-NACL 20-5-0.45 MEQ/L-%-% IV SOLN
INTRAVENOUS | Status: DC
  Administered 2012-01-21 – 2012-01-22 (×3): via INTRAVENOUS
  Filled 2012-01-21 (×6): qty 1000

## 2012-01-21 NOTE — Progress Notes (Signed)
1 Day Post-Op  Subjective: Stable and alert 8 hours postop. No nausea. No bleeding. Pain improved.  Operative findings discussed with Dr. Andrey Campanile. Apparently this was a fairly deep abscess in the left anterior position which crossed the midline in the right anterior position. There is a vessel loop crossing the midline as well as packing.  Objective: Vital signs in last 24 hours: Temp:  [97.2 F (36.2 C)-98.8 F (37.1 C)] 98.5 F (36.9 C) (06/02 0539) Pulse Rate:  [60-122] 60  (06/02 0539) Resp:  [16-25] 18  (06/02 0539) BP: (96-141)/(59-92) 110/59 mmHg (06/02 0539) SpO2:  [90 %-100 %] 93 % (06/02 0539) Weight:  [220 lb (99.791 kg)] 220 lb (99.791 kg) (06/01 2348) Last BM Date: 01/20/12  Intake/Output from previous day: 06/01 0701 - 06/02 0700 In: 900 [I.V.:900] Out: 950 [Urine:900; Blood:50] Intake/Output this shift:    General appearance: alert and stable. In minimal distress. She is reading and studying classroom meds. Incision/Wound: perianal wound shows packing and vessel loop visible. No bleeding.  Lab Results:  Results for orders placed during the hospital encounter of 01/20/12 (from the past 24 hour(s))  CBC     Status: Abnormal   Collection Time   01/20/12  4:20 PM      Component Value Range   WBC 16.8 (*) 4.0 - 10.5 (K/uL)   RBC 4.61  3.87 - 5.11 (MIL/uL)   Hemoglobin 13.8  12.0 - 15.0 (g/dL)   HCT 14.7  82.9 - 56.2 (%)   MCV 87.0  78.0 - 100.0 (fL)   MCH 29.9  26.0 - 34.0 (pg)   MCHC 34.4  30.0 - 36.0 (g/dL)   RDW 13.0  86.5 - 78.4 (%)   Platelets 221  150 - 400 (K/uL)  DIFFERENTIAL     Status: Abnormal   Collection Time   01/20/12  4:20 PM      Component Value Range   Neutrophils Relative 86 (*) 43 - 77 (%)   Neutro Abs 14.4 (*) 1.7 - 7.7 (K/uL)   Lymphocytes Relative 8 (*) 12 - 46 (%)   Lymphs Abs 1.4  0.7 - 4.0 (K/uL)   Monocytes Relative 5  3 - 12 (%)   Monocytes Absolute 0.9  0.1 - 1.0 (K/uL)   Eosinophils Relative 1  0 - 5 (%)   Eosinophils Absolute  0.1  0.0 - 0.7 (K/uL)   Basophils Relative 0  0 - 1 (%)   Basophils Absolute 0.0  0.0 - 0.1 (K/uL)  POCT I-STAT, CHEM 8     Status: Abnormal   Collection Time   01/20/12  4:47 PM      Component Value Range   Sodium 137  135 - 145 (mEq/L)   Potassium 3.7  3.5 - 5.1 (mEq/L)   Chloride 101  96 - 112 (mEq/L)   BUN 9  6 - 23 (mg/dL)   Creatinine, Ser 6.96  0.50 - 1.10 (mg/dL)   Glucose, Bld 295 (*) 70 - 99 (mg/dL)   Calcium, Ion 2.84  1.32 - 1.32 (mmol/L)   TCO2 24  0 - 100 (mmol/L)   Hemoglobin 14.3  12.0 - 15.0 (g/dL)   HCT 44.0  10.2 - 72.5 (%)  LACTIC ACID, PLASMA     Status: Normal   Collection Time   01/20/12  5:19 PM      Component Value Range   Lactic Acid, Venous 1.2  0.5 - 2.2 (mmol/L)  URINALYSIS, ROUTINE W REFLEX MICROSCOPIC     Status:  Abnormal   Collection Time   01/20/12  6:07 PM      Component Value Range   Color, Urine YELLOW  YELLOW    APPearance CLEAR  CLEAR    Specific Gravity, Urine 1.006  1.005 - 1.030    pH 6.0  5.0 - 8.0    Glucose, UA NEGATIVE  NEGATIVE (mg/dL)   Hgb urine dipstick SMALL (*) NEGATIVE    Bilirubin Urine NEGATIVE  NEGATIVE    Ketones, ur NEGATIVE  NEGATIVE (mg/dL)   Protein, ur NEGATIVE  NEGATIVE (mg/dL)   Urobilinogen, UA 0.2  0.0 - 1.0 (mg/dL)   Nitrite NEGATIVE  NEGATIVE    Leukocytes, UA NEGATIVE  NEGATIVE   URINE MICROSCOPIC-ADD ON     Status: Abnormal   Collection Time   01/20/12  6:07 PM      Component Value Range   Squamous Epithelial / LPF FEW (*) RARE    RBC / HPF 0-2  <3 (RBC/hpf)  POCT PREGNANCY, URINE     Status: Normal   Collection Time   01/20/12  6:16 PM      Component Value Range   Preg Test, Ur NEGATIVE  NEGATIVE   BASIC METABOLIC PANEL     Status: Abnormal   Collection Time   01/21/12  6:15 AM      Component Value Range   Sodium 139  135 - 145 (mEq/L)   Potassium 4.0  3.5 - 5.1 (mEq/L)   Chloride 105  96 - 112 (mEq/L)   CO2 22  19 - 32 (mEq/L)   Glucose, Bld 154 (*) 70 - 99 (mg/dL)   BUN 4 (*) 6 - 23 (mg/dL)    Creatinine, Ser 1.02  0.50 - 1.10 (mg/dL)   Calcium 8.7  8.4 - 58.5 (mg/dL)   GFR calc non Af Amer >90  >90 (mL/min)   GFR calc Af Amer >90  >90 (mL/min)  CBC     Status: Abnormal   Collection Time   01/21/12  6:15 AM      Component Value Range   WBC 15.8 (*) 4.0 - 10.5 (K/uL)   RBC 4.23  3.87 - 5.11 (MIL/uL)   Hemoglobin 12.4  12.0 - 15.0 (g/dL)   HCT 27.7  82.4 - 23.5 (%)   MCV 86.8  78.0 - 100.0 (fL)   MCH 29.3  26.0 - 34.0 (pg)   MCHC 33.8  30.0 - 36.0 (g/dL)   RDW 36.1  44.3 - 15.4 (%)   Platelets 215  150 - 400 (K/uL)     Studies/Results: @RISRSLT24 @     . docusate sodium  100 mg Oral BID  . enoxaparin  40 mg Subcutaneous Q24H  .  morphine injection  4 mg Intravenous Once  . oxyCODONE-acetaminophen  1 tablet Oral Once  . oxyCODONE-acetaminophen  2 tablet Oral Once  . piperacillin-tazobactam (ZOSYN)  IV  3.375 g Intravenous Q8H  . sodium chloride  1,000 mL Intravenous Once  . vancomycin  1,000 mg Intravenous Q8H  . venlafaxine XR  225 mg Oral Daily  . 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 Stable. Begin daily MiraLAX Advance diet Continue vancomycin and Zosyn IV I told her that she would need to be hospitalized for another 2 days, probably, because of the complexity of the  infection. We will remove the packing tomorrow but leave  the vessel loop in place and begin sitz baths.  DVT prophylaxis. On Lovenox     LOS: 1 day    Nakira Litzau M. Derrell Lolling, M.D., [facility] General and Minimally invasive Surgery Breast and Colorectal Surgery Office:   [phone] Pager:   [phone]  01/21/2012  . .prob

## 2012-01-21 NOTE — Op Note (Signed)
NAMERAVYNN, HOGATE NO.:  [id number]  MEDICAL RECORD NO.:  [id number]  LOCATION:  [facility]                         FACILITY:  [facility]  PHYSICIAN:  Mary Sella. Andrey Campanile, MD, FACSDATE OF BIRTH:  Dec 27, 1980  DATE OF PROCEDURE:  01/20/2012 DATE OF DISCHARGE:                              OPERATIVE REPORT   PREOPERATIVE DIAGNOSIS:  Left perirectal abscess.  POSTOPERATIVE DIAGNOSIS:  Left anterior ischiorectal abscess.  PROCEDURE:  Exam under anesthesia, Incision and drainage of left anterior ischiorectal abscess with placement of a vessel loop through abscess cavity.  SURGEON:  Mary Sella. Andrey Campanile, MD, FACS.  ASSISTANT SURGEON:  None.  ANESTHESIA:  General.  ESTIMATED BLOOD LOSS:  Minimal.  SPECIMEN:  Ischiorectal abscess.  CULTURE:  Sent to Micro.  INDICATIONS FOR PROCEDURE:  The patient is a very pleasant 31 year old, morbidly obese, Caucasian female who started developing perirectal pain on Wednesday, progressively worsened over the past several days, prompting her to come to the emergency room this evening.  She was found to have elevated white blood cell count and some tenderness on the perianal area.  The ER reported a CT scan of the pelvis, which demonstrated a perirectal abscess on the left, measuring at least 3 cm x 2 cm.  On exam, she had induration and fluctuance on the left anterior position.  I recommended incision and drainage in the operating room. We discussed at length the risks and benefits as well as alternative treatment options.  We discussed the risks including, but not limited to, bleeding, infection, injury to surrounding structures such as the sphincter muscle, recurrent infection, worsening infection, blood clot formation, anesthesia risks, need for additional procedures, urinary retention, fistula-in-Ano formation, as well as typical postoperative course.  She elected to proceed with surgery.  DESCRIPTION OF PROCEDURE:  After obtaining an  informed consent, the patient was taken to the operative room 16 at [facility].  She was placed supine on the operating table.  General endotracheal anesthesia was established.  She was then placed in lithotomy position with the appropriate padding and sequential compression devices had been placed on her lower extremities.  She received preoperative antibiotics.  She had appropriate padding around the legs.  Her perineum including the vaginal area and buttocks were prepped and draped with Betadine.  A surgical time-out was performed. Digital rectal exam was performed, which demonstrated no gross palpable lesions or fluctuance within the anal canal.  She had a small, nonthrombosed, redundant, external hemorrhoidal skin on the right side. In the left anterior position, she had an area of induration and fluctuance, really not much and hardly if any at all cellulitis. Anoscopy was performed in a circumferential fashion, which demonstrated some internal hemorrhoids both on the left anterior and lateral position as well as in the right lateral position as well.  I made a 1-1/2 inch incision directly over the maximal area of fluctuance and induration in the left anterior position.  There was gross drainage of seropurulent fluid.  There was also some small clots in this.  Aerobic and anaerobic cultures were obtained.  I was able to place my finger into the abscess cavity.  It tracked anteriorly and  actually crossed the midline.  I also tracked cephalad as well.  I was able to express a large amount of seropurulent material.  Because the abscess cavity was quite large, probably at least 4.5-5 cm deep, I did make a counter incision with electrocautery in the right anterior position with abscess cavity extended over.  I then tunneled a blue vessel loop through the abscess cavity and tied the anvil together with two 2-0 silk ties.  The excess vessel loop was trimmed off.  The wound  cavity was irrigated. Hemostasis was achieved with electrocautery.  Quarter-inch iodoform gauze was packed thoroughly into the abscess cavity, followed by 4x4s, fluffs, and mesh underwear.  The patient was extubated and taken to the recovery room in stable condition.  All needle, instrument counts were correct x2.  There were no immediate complications.  The patient tolerated the procedure well.     Mary Sella. Andrey Campanile, MD, FACS     EMW/MEDQ  D:  01/20/2012  T:  01/21/2012  Job:  [id number]

## 2012-01-21 NOTE — Progress Notes (Signed)
ANTIBIOTIC CONSULT NOTE - INITIAL  Pharmacy Consult for Vancomycin/zosyn Indication: perirectal abscess s/p I&D   No Known Allergies  Patient Measurements: Height: 5\' 2"  (157.5 cm) Weight: 220 lb (99.791 kg) IBW/kg (Calculated) : 50.1   Vital Signs: Temp: 98.6 F (37 C) (06/01 2348) Temp src: Oral (06/01 2348) BP: 114/92 mmHg (06/01 2348) Pulse Rate: 82  (06/01 2348) Intake/Output from previous day: 06/01 0701 - 06/02 0700 In: 900 [I.V.:900] Out: 50 [Blood:50] Intake/Output from this shift: Total I/O In: 900 [I.V.:900] Out: 50 [Blood:50]  Labs:  [facility] 01/20/12 1647 01/20/12 1620  WBC -- 16.8*  HGB 14.3 13.8  PLT -- 221  LABCREA -- --  CREATININE 0.70 --   Estimated Creatinine Clearance: 113.6 ml/min (by C-G formula based on Cr of 0.7). No results found for this basename: VANCOTROUGH:2,VANCOPEAK:2,VANCORANDOM:2,GENTTROUGH:2,GENTPEAK:2,GENTRANDOM:2,TOBRATROUGH:2,TOBRAPEAK:2,TOBRARND:2,AMIKACINPEAK:2,AMIKACINTROU:2,AMIKACIN:2, in the last 72 hours   Microbiology: No results found for this or any previous visit (from the past 720 hour(s)).  Medical History: History reviewed. No pertinent past medical history.   Assessment: 30 YOF with Left perirectal abscess, s/p I&D, to start vancomycin and zosyn. Scr 0.7, est. crcl > 100, abscess culture pending.   Goal of Therapy:  Vancomycin trough level 10-15 mcg/ml  Plan:  - Vancomycin 1g IV Q 8hrs - Zosyn 3.375g IV Q 8hrs - f/u renal function and culture - check vancomycin trough at steady state.  Bayard Hugger, PharmD, BCPS  Clinical Pharmacist  Pager: [phone]  01/21/2012,12:12 AM

## 2012-01-22 ENCOUNTER — Encounter (HOSPITAL_COMMUNITY): Payer: Self-pay | Admitting: General Surgery

## 2012-01-22 MED ORDER — POLYETHYLENE GLYCOL 3350 17 G PO PACK
17.0000 g | PACK | Freq: Two times a day (BID) | ORAL | Status: DC
  Administered 2012-01-22: 17 g via ORAL
  Filled 2012-01-22 (×3): qty 1

## 2012-01-22 NOTE — ED Provider Notes (Signed)
Medical screening examination/treatment/procedure(s) were performed by non-physician practitioner and as supervising physician I was immediately available for consultation/collaboration.  Dacy Enrico, MD 01/22/12 0946 

## 2012-01-22 NOTE — Progress Notes (Addendum)
2 Days Post-Op  Subjective: Doing well, no pain at current, no chills or fevers, eating well  Objective: Vital signs in last 24 hours: Temp:  [97.3 F (36.3 C)-97.9 F (36.6 C)] 97.9 F (36.6 C) (06/03 0543) Pulse Rate:  [51-85] 64  (06/03 0543) Resp:  [18] 18  (06/03 0543) BP: (95-110)/(61-68) 95/61 mmHg (06/03 0543) SpO2:  [94 %-98 %] 94 % (06/03 0543) Last BM Date: 01/20/12  Intake/Output from previous day: 06/02 0701 - 06/03 0700 In: 1520 [P.O.:720; I.V.:800] Out: 2300 [Urine:2300] Intake/Output this shift: Total I/O In: 240 [P.O.:240] Out: -   General appearance: alert, cooperative and no distress Skin: Skin color, texture, turgor normal. No rashes or lesions Incision/Wound:  Removed surgical packing from perirectal abscess, no bleeding, min pain during removal of packing, tolerated well, repacked with approx 6cm iodoform, dressing applied, overall wound clean, no purulent drainage, no erythema  Lab Results:   Basename 01/21/12 0615 01/20/12 1647 01/20/12 1620  WBC 15.8* -- 16.8*  HGB 12.4 14.3 --  HCT 36.7 42.0 --  PLT 215 -- 221   BMET  Basename 01/21/12 0615 01/20/12 1647  NA 139 137  K 4.0 3.7  CL 105 101  CO2 22 --  GLUCOSE 154* 114*  BUN 4* 9  CREATININE 0.60 0.70  CALCIUM 8.7 --   PT/INR No results found for this basename: LABPROT:2,INR:2 in the last 72 hours ABG No results found for this basename: PHART:2,PCO2:2,PO2:2,HCO3:2 in the last 72 hours  Studies/Results: Ct Pelvis W Contrast  01/20/2012  *RADIOLOGY REPORT*  Clinical Data:  Rectal pain and bleeding.  Fever.  CT PELVIS WITH CONTRAST  Technique:  Multidetector CT imaging of the pelvis was performed using the standard protocol following the bolus administration of intravenous contrast.  Contrast: OMNIPAQUE IOHEXOL 300 MG/ML  SOLN  Comparison:  Ultrasound dated 07/03/2007.  Findings:  Mild concentric inferior rectal wall thickening.  On the most inferior images, there is a partially  included low density mass-like area to the left and anterior to the inferior rectum, measuring 3.0 x 2.6 cm one image number 54.  3.4 cm right ovarian cyst, not seen on the previous ultrasound.  No enlarged lymph nodes.  Unremarkable bones.  IMPRESSION:  1.  Mild diffuse inferior rectal wall thickening with a probable small left perirectal/perianal abscess, not included in its entirety.  The included portion measures 3.0 cm in maximum diameter. 2.  Interval 3.0 cm right ovarian cyst.  Original Report Authenticated By: Darrol Angel, M.D.    Anti-infectives: Anti-infectives     Start     Dose/Rate Route Frequency Ordered Stop   01/21/12 0100   vancomycin (VANCOCIN) IVPB 1000 mg/200 mL premix        1,000 mg 200 mL/hr over 60 Minutes Intravenous Every 8 hours 01/21/12 0025     01/21/12 0100   piperacillin-tazobactam (ZOSYN) IVPB 3.375 g        3.375 g 12.5 mL/hr over 240 Minutes Intravenous Every 8 hours 01/21/12 0025            Assessment/Plan: 1.  s/p Procedure(s) (LRB): IRRIGATION AND DEBRIDEMENT PERIRECTAL ABSCESS (N/A) - begin SITZ bathes TID, leave blue loop in place per Dr. Andrey Campanile, repack with 5-6cm 1/4in Iodoform packing after SITZ, may want to pre-medicate prior to packing prn.  Continue IV abx today and switch to po tomorrow.  Should be ready for d/c in 1-2 days.    LOS: 2 days    WHITE, ELIZABETH 01/22/2012 11:02 AM  Patient seen and examined. I agree with the evaluation and treatment plan as outlined above. We will reexamine the wounds tomorrow. Depending on pain control and bowel function as well as infection control, we will decide if she is ready to go home.   Miralax ordered.Angelia Mould. Derrell Lolling, M.D., [facility] General and Minimally invasive Surgery Breast and Colorectal Surgery Office:   [phone] Pager:   [phone]

## 2012-01-23 MED ORDER — DSS 100 MG PO CAPS: 100.0000 mg | ORAL_CAPSULE | Freq: Two times a day (BID) | ORAL | Status: AC

## 2012-01-23 MED ORDER — METRONIDAZOLE 500 MG PO TABS: 500.0000 mg | ORAL_TABLET | Freq: Three times a day (TID) | ORAL | Status: DC

## 2012-01-23 MED ORDER — CIPROFLOXACIN HCL 500 MG PO TABS: 500.0000 mg | ORAL_TABLET | Freq: Two times a day (BID) | ORAL | Status: DC

## 2012-01-23 NOTE — Progress Notes (Signed)
Agree with assessment and plan. See Dr Andrey Campanile in i 1 week   Jessica Downs. Derrell Lolling, M.D., [facility] General and Minimally invasive Surgery Breast and Colorectal Surgery Office:   [phone] Pager:   [phone]

## 2012-01-23 NOTE — Discharge Summary (Signed)
Physician Discharge Summary  Patient ID: Jessica Downs MRN: [id number] DOB/AGE: 11-05-1980 31 y.o.  Admit date: 01/20/2012 Discharge date: 01/23/2012  Admission Diagnoses:perirectal abcess  Discharge Diagnoses:  Principal Problem:  *Perirectal abscess   Discharged Condition: stable  Hospital Course: Patient was admitted for surgical incision and drainage of a perirectal abcess on 01/20/12. Patient tolerated procedure well, and was treated with IV antibiotics, she has remained pain and fever free postoperatively.   Consults: None  Significant Diagnostic Studies: microbiology: wound culture: pending  Treatments: antibiotics  Discharge Exam: Blood pressure 103/65, pulse 60, temperature 97.6 F (36.4 C), temperature source Oral, resp. rate 18, height 5\' 2"  (1.575 m), weight 220 lb (99.791 kg), last menstrual period 12/22/2011, SpO2 96.00%. Physical exam: Incision appears clean, without erythema or drainage.  Disposition: Discharge to home on oral antibiotics, f/u with Dr. Andrey Campanile in 1 weeks time. She is to continue with Sitz baths tid.  Medication List  As of 01/23/2012  8:38 AM   STOP taking these medications         hydrocortisone 2.5 % rectal cream      hydrocortisone 25 MG suppository         TAKE these medications         DSS 100 MG Caps   Take 100 mg by mouth 2 (two) times daily.      ibuprofen 200 MG tablet   Commonly known as: ADVIL,MOTRIN   Take 800 mg by mouth once.      venlafaxine XR 75 MG 24 hr capsule   Commonly known as: EFFEXOR-XR   Take 225 mg by mouth daily.             SignedBlenda Mounts 01/23/2012, 8:38 AM

## 2012-01-23 NOTE — Progress Notes (Signed)
3 Days Post-Op  Subjective: Patient feels great this morning no complaint of pain/fever.  Objective: Vital signs in last 24 hours: Temp:  [97.6 F (36.4 C)-98 F (36.7 C)] 97.6 F (36.4 C) (06/04 0617) Pulse Rate:  [60-73] 60  (06/04 0617) Resp:  [18] 18  (06/04 0617) BP: (103-108)/(63-75) 103/65 mmHg (06/04 0617) SpO2:  [96 %-98 %] 96 % (06/04 0617) Last BM Date: 01/20/12  Intake/Output from previous day: 06/03 0701 - 06/04 0700 In: 1670 [P.O.:720; I.V.:950] Out: 400 [Urine:400] Intake/Output this shift:   Physical examination: wound appears clean, no exudate noted, no erythema.  Lab Results:   Basename 01/21/12 0615 01/20/12 1647 01/20/12 1620  WBC 15.8* -- 16.8*  HGB 12.4 14.3 --  HCT 36.7 42.0 --  PLT 215 -- 221   BMET  Basename 01/21/12 0615 01/20/12 1647  NA 139 137  K 4.0 3.7  CL 105 101  CO2 22 --  GLUCOSE 154* 114*  BUN 4* 9  CREATININE 0.60 0.70  CALCIUM 8.7 --   PT/INR No results found for this basename: LABPROT:2,INR:2 in the last 72 hours ABG No results found for this basename: PHART:2,PCO2:2,PO2:2,HCO3:2 in the last 72 hours  Studies/Results: No results found.  Anti-infectives: Anti-infectives     Start     Dose/Rate Route Frequency Ordered Stop   01/21/12 0100   vancomycin (VANCOCIN) IVPB 1000 mg/200 mL premix        1,000 mg 200 mL/hr over 60 Minutes Intravenous Every 8 hours 01/21/12 0025     01/21/12 0100   piperacillin-tazobactam (ZOSYN) IVPB 3.375 g        3.375 g 12.5 mL/hr over 240 Minutes Intravenous Every 8 hours 01/21/12 0025            Assessment/Plan: s/p Procedure(s) (LRB): IRRIGATION AND DEBRIDEMENT PERIRECTAL ABSCESS (N/A) 1. Status post perirectal abcess surgical drainage.  Plan:  1. Discontinue IV antibiotics 2. Continue with Sitz baths tid 3. Switch to oral antibiotics 4. Discharge to home with f/u in 1 weeks time with Dr. Andrey Campanile.  LOS: 3 days    Jessica Downs 01/23/2012

## 2012-01-23 NOTE — Discharge Instructions (Signed)
[facility] _______[facility]  RECTAL SURGERY POST OP INSTRUCTIONS: POST OP INSTRUCTIONS  Always review your discharge instruction sheet given to you by the facility where your surgery was performed. IF YOU HAVE DISABILITY OR FAMILY LEAVE FORMS, YOU MUST BRING THEM TO THE OFFICE FOR PROCESSING.   DO NOT GIVE THEM TO YOUR DOCTOR.  1. A  prescription for pain medication may be given to you upon discharge.  Take your pain medication as prescribed, if needed.  If narcotic pain medicine is not needed, then you may take acetaminophen (Tylenol) or ibuprofen (Advil) as needed. 2. Take your usually prescribed medications unless otherwise directed. 3. If you need a refill on your pain medication, please contact your pharmacy.  They will contact our office to request authorization. Prescriptions will not be filled after 5 pm or on week-ends. 4. You should follow a light diet the first 48 hours after arrival home, such as soup and crackers, etc.  Be sure to include lots of fluids daily.  Resume your normal diet 2-3 days after surgery.. 5. Most patients will experience some swelling and discomfort in the rectal area. Ice packs, reclining and warm tub soaks will help.  Swelling and discomfort can take several days to resolve.  6. It is common to experience some constipation if taking pain medication after surgery.  Increasing fluid intake and taking a stool softener (such as Colace) will usually help or prevent this problem from occurring.  A mild laxative (Milk of Magnesia or Miralax) should be taken according to package directions if there are no bowel movements after 48 hours. 7. Unless discharge instructions indicate otherwise, leave your bandage dry and in place for 24 hours, or remove the bandage if you have a bowel movement. You may notice a small amount of bleeding with bowel movements for the first few days. You  have a rubber band going through the abscess cavity to help promote drainage. It will  stay in place and be removed at one of your follow-up appointments. You will need to wear an absorbent pad or soft cotton gauze in your underwear until the drainage stops.it. 8. ACTIVITIES:  You may resume regular (light) daily activities beginning the next day--such as daily self-care, walking, climbing stairs--gradually increasing activities as tolerated.  You may have sexual intercourse when it is comfortable.  Refrain from any heavy lifting or straining until approved by your doctor. a. You may drive when you are no longer taking prescription pain medication, you can comfortably wear a seatbelt, and you can safely maneuver your car and apply brakes. b. RETURN TO WORK: 9. You should see your doctor in the office for a follow-up appointment approximately 2-3 weeks after your surgery.  Make sure that you call for this appointment within a day or two after you arrive home to insure a convenient appointment time. 10. OTHER INSTRUCTIONS:   11. Sitz baths three times a day WHEN TO CALL YOUR DOCTOR: 1. Fever over 101.0 2. Inability to urinate 3. Nausea and/or vomiting 4. Extreme swelling or bruising 5. Continued bleeding from rectum. 6. Increased pain, redness, or drainage from the incision 7. Constipation  The clinic staff is available to answer your questions during regular business hours.  Please don't hesitate to call and ask to speak to one of the nurses for clinical concerns.  If you have a medical emergency, go to the nearest emergency room or call 911.  A surgeon from [facility] is always on call at the  hospital   [address] [place], [place], [place]  [address] ?  [address], [place], [place]   [address] [phone] ? [phone] ? FAX [phone] Web site: [email or web address]

## 2012-01-23 NOTE — Discharge Summary (Signed)
Agree with DC summary and plans.

## 2012-01-24 LAB — CULTURE, ROUTINE-ABSCESS

## 2012-01-26 ENCOUNTER — Encounter (INDEPENDENT_AMBULATORY_CARE_PROVIDER_SITE_OTHER): Payer: Self-pay | Admitting: General Surgery

## 2012-01-26 LAB — ANAEROBIC CULTURE

## 2012-02-06 ENCOUNTER — Telehealth (INDEPENDENT_AMBULATORY_CARE_PROVIDER_SITE_OTHER): Payer: Self-pay | Admitting: General Surgery

## 2012-02-06 NOTE — Telephone Encounter (Signed)
Pt calling to report increased soreness and swollen; denies fever.  She thinks she may have "dislodged" a band, though.  Her next appt is in two days and she does not feel the need to be seen sooner.

## 2012-02-08 ENCOUNTER — Encounter (INDEPENDENT_AMBULATORY_CARE_PROVIDER_SITE_OTHER): Payer: Self-pay | Admitting: General Surgery

## 2012-02-08 ENCOUNTER — Ambulatory Visit (INDEPENDENT_AMBULATORY_CARE_PROVIDER_SITE_OTHER): Payer: Medicaid Other | Admitting: General Surgery

## 2012-02-08 VITALS — BP 126/74 | HR 72 | Temp 97.9°F | Resp 16 | Ht 62.0 in | Wt 224.0 lb

## 2012-02-08 DIAGNOSIS — Z09 Encounter for follow-up examination after completed treatment for conditions other than malignant neoplasm: Secondary | ICD-10-CM

## 2012-02-08 MED ORDER — OXYCODONE-ACETAMINOPHEN 5-325 MG PO TABS: 1.0000 | ORAL_TABLET | ORAL | Status: DC | PRN

## 2012-02-08 MED ORDER — DOXYCYCLINE HYCLATE 100 MG PO TABS: 100.0000 mg | ORAL_TABLET | Freq: Two times a day (BID) | ORAL | Status: DC

## 2012-02-08 NOTE — Progress Notes (Signed)
Procedure: Status post exam under anesthesia, incision and drainage of perirectal abscess in June 1  History of Present Ilness: 31 year old Caucasian female comes in today for her postoperative appointment. She was discharged from the hospital on June 4. She states that she did well for the past several days; however, this past Sunday she started having some discomfort in the anterior perianal region. It causes more pain when she sits for prolonged periods. She has recently finished her antibiotics.she is having daily bowel movements. She denies any fevers or chills. She denies any trouble urinating. She denies any incontinence or drainage.  Physical Exam: BP 126/74  Pulse 72  Temp 97.9 F (36.6 C) (Temporal)  Resp 16  Ht 5\' 2"  (1.575 m)  Wt 224 lb (101.606 kg)  BMI 40.97 kg/m2  LMP 12/22/2011 Well-developed, well-nourished Caucasian female in no apparent distress Rectal-vessel loop intact in ant position. Some edema in ant perineum. No cellulitis or fluctance. Incisions closing. About a marble size area of induration anterior to incision & deep  Assessment and Plan: 31 year old Caucasian female status post incision and drainage of perirectal abscess. There is no gross evidence of abscess recurrence. However the recurrence of her discomfort and a small amount of induration anterior to the incision is a little concerning. Restart doxycycline. rx for percocet. Discussed bowel regimen. RTC 10-12 days or sooner if symptoms worsen.  Mary Sella. Andrey Campanile, MD, FACS General, Bariatric, & Minimally Invasive Surgery [facility], [place]

## 2012-02-08 NOTE — Patient Instructions (Signed)
Call if symptoms worsen Drink plenty of water and eat a high fiber diet

## 2012-02-20 ENCOUNTER — Encounter (INDEPENDENT_AMBULATORY_CARE_PROVIDER_SITE_OTHER): Payer: Self-pay | Admitting: General Surgery

## 2012-02-20 ENCOUNTER — Ambulatory Visit (INDEPENDENT_AMBULATORY_CARE_PROVIDER_SITE_OTHER): Payer: Medicaid Other | Admitting: General Surgery

## 2012-02-20 VITALS — BP 122/88 | HR 101 | Temp 98.9°F | Resp 16 | Ht 62.0 in | Wt 223.0 lb

## 2012-02-20 DIAGNOSIS — Z09 Encounter for follow-up examination after completed treatment for conditions other than malignant neoplasm: Secondary | ICD-10-CM

## 2012-02-20 NOTE — Progress Notes (Signed)
Subjective:     Patient ID: Jessica Downs, female   DOB: 02/17/1981, 31 y.o.   MRN: [id number]  HPI 31 year old Caucasian female who underwent incision and drainage of a left-sided rectal abscess on June 1 comes in for followup. I last saw her on June 20. At that time she had an area of induration in the left anterior position. She had some tenderness in that area as well. We placed her on doxycycline. She states several days after taking antibiotics the pain completely went away. She denies any fevers or chills. She denies any nausea or vomiting. She denies any dysuria. She denies any drainage.  Review of Systems     Objective:   Physical Exam BP 122/88  Pulse 101  Temp 98.9 F (37.2 C) (Temporal)  Resp 16  Ht 5\' 2"  (1.575 m)  Wt 223 lb (101.152 kg)  BMI 40.79 kg/m2 Alert, nad Rectal - well healed left anterior incision. No induration/TTP. Some visible hemorrhoidal disease- mainly rt ant and left lateral - non thrombosed or prolapsed. Has a 1.5cm skin tag in anterior midline -appears to be coming off of an non-thrombosed ext hemorrhoidal tissue    Assessment:     S/p I&D perirectal abscess    Plan:     The area appears to be healed. The patient desires excision of her skin tag. I advised her I would like to wait 2- 3 months before excising this area. I would like to make sure the area is completely healed first. Followup 3 months. The patient was advised to continue with a high fiber diet as well as a high water intake of 6-8 glasses per day  Mary Sella. Andrey Campanile, MD, FACS General, Bariatric, & Minimally Invasive Surgery [facility], [place]

## 2012-02-20 NOTE — Patient Instructions (Signed)
Continue to drink 6-8 glasses of water per day Continue to eat a high fiber diet 

## 2012-06-10 ENCOUNTER — Other Ambulatory Visit: Payer: Self-pay | Admitting: Family Medicine

## 2012-06-10 DIAGNOSIS — N6310 Unspecified lump in the right breast, unspecified quadrant: Secondary | ICD-10-CM

## 2012-06-14 ENCOUNTER — Ambulatory Visit
Admission: RE | Admit: 2012-06-14 | Discharge: 2012-06-14 | Disposition: A | Discharge status: Home / Self Care | Payer: Medicaid Other | Source: Ambulatory Visit | Attending: Family Medicine | Admitting: Family Medicine

## 2012-06-14 ENCOUNTER — Other Ambulatory Visit: Payer: Self-pay | Admitting: Family Medicine

## 2012-06-14 DIAGNOSIS — N6310 Unspecified lump in the right breast, unspecified quadrant: Secondary | ICD-10-CM

## 2012-06-28 ENCOUNTER — Encounter (INDEPENDENT_AMBULATORY_CARE_PROVIDER_SITE_OTHER): Payer: Self-pay | Admitting: General Surgery

## 2012-06-28 ENCOUNTER — Ambulatory Visit (INDEPENDENT_AMBULATORY_CARE_PROVIDER_SITE_OTHER): Payer: Medicaid Other | Admitting: General Surgery

## 2012-06-28 VITALS — BP 134/72 | HR 76 | Temp 98.1°F | Resp 16 | Ht 62.0 in | Wt 193.1 lb

## 2012-06-28 DIAGNOSIS — K644 Residual hemorrhoidal skin tags: Secondary | ICD-10-CM

## 2012-06-28 MED ORDER — OXYCODONE-ACETAMINOPHEN 5-325 MG PO TABS: 1.0000 | ORAL_TABLET | ORAL | Status: DC | PRN

## 2012-06-28 NOTE — Progress Notes (Signed)
Subjective:     Patient ID: Jessica Downs, female   DOB: [birth year], 31 y.o.   MRN: [id number]  HPI  31 year old Caucasian female comes in today for followup as well as for a procedure in the procedure room. I last saw her in July. She recovered from a perirectal abscess. However she was complaining of a anal skin tag that she would like to have removed. She comes in today to have that done. She states that she is having daily bowel movements. She denies any pain with defecation. She denies any incontinence. She denies any drainage. She denies any hemorrhoids protruding when having a bowel movement. She denies any perianal itching or burning. She states that the skin tag is still there.  PMHx, PSHx, SOCHx, FAMHx, ALL reviewed   Review of Systems As above    Objective:   Physical Exam BP 134/72  Pulse 76  Temp 98.1 F (36.7 C) (Temporal)  Resp 16  Ht 5\' 2"  (1.575 m)  Wt 193 lb 2 oz (87.601 kg)  BMI 35.32 kg/m2  Gen: alert, NAD, non-toxic appearing Pupils: equal, no scleral icterus Abd: soft, nontender, nondistended. Well-healed trocar sites. No cellulitis. No incisional hernia Ext: no edema, no calf tenderness Skin: no rash, no jaundice Rectal: about 1.5cm long right anterior anal skin tag; well healed left perirectal incision. No fissure. No thrombosed hemorrhoid. Some non-inflammed nonthrombosed ext hemorrhoid     Assessment:     Anal Skin Tag    Plan:     After obtaining verbal consent, the area was prepped with Betadine. 4 cc of 2% Xylocaine with epinephrine mixed with sodium bicarbonate was infiltrated at the base of the skin tag and around the skin and subcutaneous tissue in the anterior rectal position. The skin tag was sharply excised at its base with a #11 blade. The skin defect was reapproximated with 2 interrupted 3-0 chromic sutures. The patient tolerated the procedure well. Minimal blood loss.  The skin tag was discarded. The patient was given wound care  instructions as well as rectal surgery instructions. She was encouraged to eat high fiber diet, drink plenty of water, avoid constipation, and take a laxative as needed in order to prevent constipation. She was given a prescription for pain meds and encouraged to use them sparingly. Followup 4 weeks  Mary Sella. Andrey Campanile, MD, FACS General, Bariatric, & Minimally Invasive Surgery [facility], [place]

## 2012-06-28 NOTE — Patient Instructions (Signed)
[facility] _______[facility]  RECTAL SURGERY POST OP INSTRUCTIONS: POST OP INSTRUCTIONS  Always review your discharge instruction sheet given to you by the facility where your surgery was performed. IF YOU HAVE DISABILITY OR FAMILY LEAVE FORMS, YOU MUST BRING THEM TO THE OFFICE FOR PROCESSING.   DO NOT GIVE THEM TO YOUR DOCTOR.  1. A  prescription for pain medication may be given to you upon discharge.  Take your pain medication as prescribed, if needed.  If narcotic pain medicine is not needed, then you may take acetaminophen (Tylenol) or ibuprofen (Advil) as needed. 2. Take your usually prescribed medications unless otherwise directed. 3. If you need a refill on your pain medication, please contact your pharmacy.  They will contact our office to request authorization. Prescriptions will not be filled after 5 pm or on week-ends. 4. You should follow a light diet the first 48 hours after arrival home, such as soup and crackers, etc.  Be sure to include lots of fluids daily.  Resume your normal diet 2-3 days after surgery.. 5. Most patients will experience some swelling and discomfort in the rectal area. Ice packs, reclining and warm tub soaks will help.  Swelling and discomfort can take several days to resolve.  6. It is common to experience some constipation if taking pain medication after surgery.  Increasing fluid intake and taking a stool softener (such as Colace) will usually help or prevent this problem from occurring.  A mild laxative (Milk of Magnesia or Miralax) should be taken according to package directions if there are no bowel movements after 48 hours. 7. Unless discharge instructions indicate otherwise, leave your bandage dry and in place for 24 hours, or remove the bandage if you have a bowel movement. You may notice a small amount of bleeding with bowel movements for the first few days. You will need to wear an absorbent pad or soft cotton gauze in your underwear until the  drainage stops.it. 8. ACTIVITIES:  You may resume regular (light) daily activities beginning the next day-such as daily self-care, walking, climbing stairs-gradually increasing activities as tolerated.  You may have sexual intercourse when it is comfortable.  Refrain from any heavy lifting or straining until approved by your doctor. a. You may drive when you are no longer taking prescription pain medication, you can comfortably wear a seatbelt, and you can safely maneuver your car and apply brakes. b. RETURN TO WORK: next day c.  9. You should see your doctor in the office for a follow-up appointment approximately 2-3 weeks after your surgery.  Make sure that you call for this appointment within a day or two after you arrive home to insure a convenient appointment time. 10. OTHER INSTRUCTIONS:   WHEN TO CALL YOUR DOCTOR: 1. Fever over 101.0 2. Inability to urinate 3. Nausea and/or vomiting 4. Extreme swelling or bruising 5. Continued bleeding from rectum. 6. Increased pain, redness, or drainage from the incision 7. Constipation  The clinic staff is available to answer your questions during regular business hours.  Please don't hesitate to call and ask to speak to one of the nurses for clinical concerns.  If you have a medical emergency, go to the nearest emergency room or call 911.  A surgeon from [facility] is always on call at the hospital   [address], [address], [place], [place]  [address] ?  [address] [address], [place], [place]   [address] [phone] ? [phone] ? FAX [phone] Web  site: [email or web address]    

## 2012-07-31 ENCOUNTER — Encounter (INDEPENDENT_AMBULATORY_CARE_PROVIDER_SITE_OTHER): Payer: Self-pay | Admitting: General Surgery

## 2012-07-31 ENCOUNTER — Ambulatory Visit (INDEPENDENT_AMBULATORY_CARE_PROVIDER_SITE_OTHER): Payer: Medicaid Other | Admitting: General Surgery

## 2012-07-31 VITALS — BP 122/70 | HR 88 | Resp 16 | Ht 62.5 in | Wt 195.0 lb

## 2012-07-31 DIAGNOSIS — Z09 Encounter for follow-up examination after completed treatment for conditions other than malignant neoplasm: Secondary | ICD-10-CM

## 2012-07-31 NOTE — Progress Notes (Signed)
Subjective:     Patient ID: Jessica Downs, female   DOB: 08-15-1981, 31 y.o.   MRN: [id number]  HPI 31 yo WF comes in for f/u after undergoing excision of anal skin tage in the office on 11/8. She states she has been doing well. No drainage, pain, bleeding, constipation, diarrhea, itching.   Review of Systems     Objective:   Physical Exam BP 122/70  Pulse 88  Resp 16  Ht 5' 2.5" (1.588 m)  Wt 195 lb (88.451 kg)  BMI 35.10 kg/m2 Alert, nad Rectal - scattered pimples on b/l buttocks, no ext hemorrhoid. Well healed anterior midline incision    Assessment:     S/p excision of anal skin tag    Plan:     Area well healed. Discussed importance of high fiber diet and drinking plenty of water. F/u prn  Mary Sella. Andrey Campanile, MD, FACS General, Bariatric, & Minimally Invasive Surgery [facility], [place]

## 2012-07-31 NOTE — Patient Instructions (Signed)
Drink 6-8 glasses of water per day Eat a high fiber diet

## 2013-09-23 IMAGING — CT CT PELVIS W/ CM
2 of 3 series · 17 of 46 positions shown, 19 images · IV contrast (omnipaque)
Comparison: Ultrasound dated 07/03/2007.

CLINICAL DATA: Rectal pain and bleeding.  Fever.

CT PELVIS WITH CONTRAST
TECHNIQUE: Multidetector CT imaging of the pelvis was performed
using the standard protocol following the bolus administration of
intravenous contrast.
Contrast: 100mL OMNIPAQUE IOHEXOL 300 MG/ML  SOLN

[Series 2: routine abdomen · axial · 0.78mm/px · z∈[-195,-20]mm · 14 of 41 slices shown, 16 images]
[im 3/41  soft-tissue]
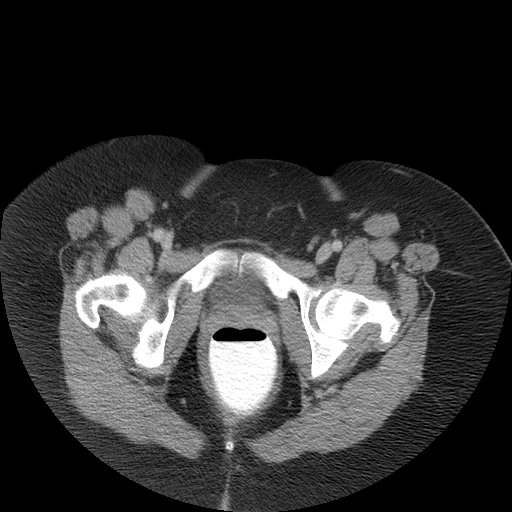
[im 3/41  bone]
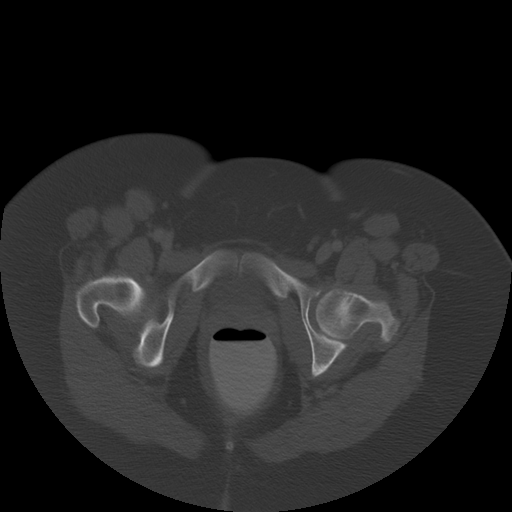
[im 6/41  soft-tissue]
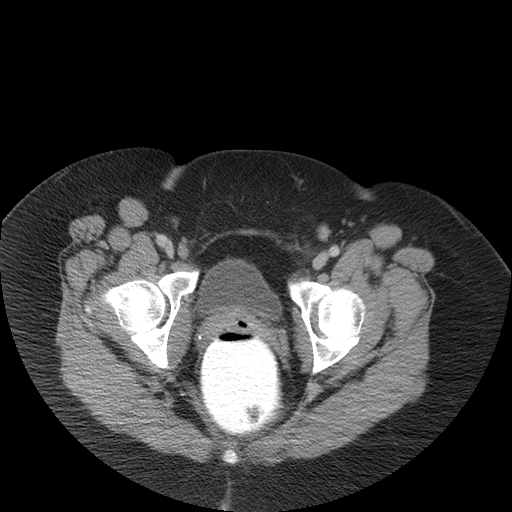
[im 8/41  soft-tissue]
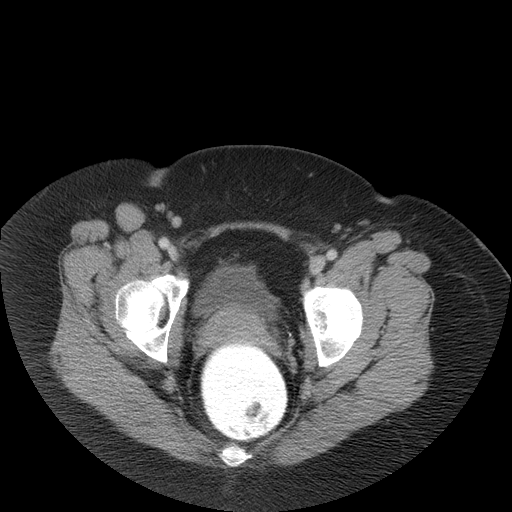
[im 11/41  soft-tissue]
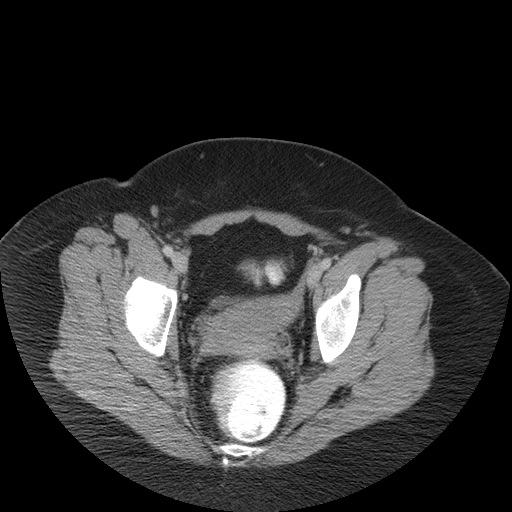
[im 13/41  soft-tissue]
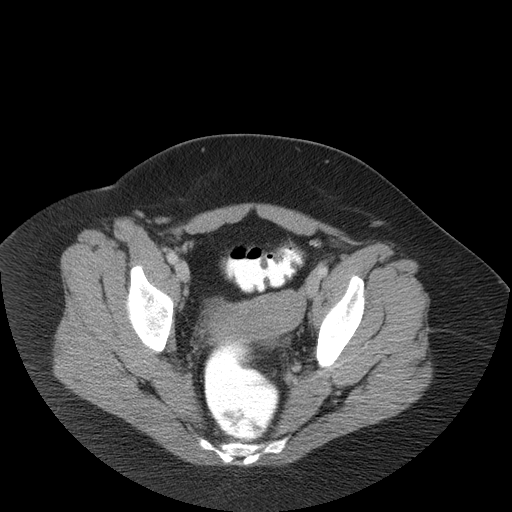
[im 16/41  soft-tissue]
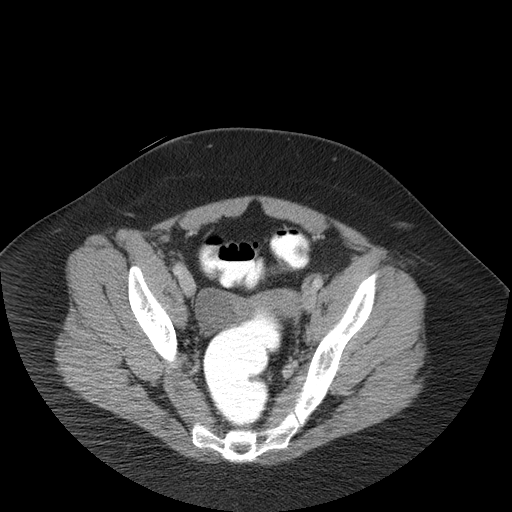
[im 19/41  soft-tissue]
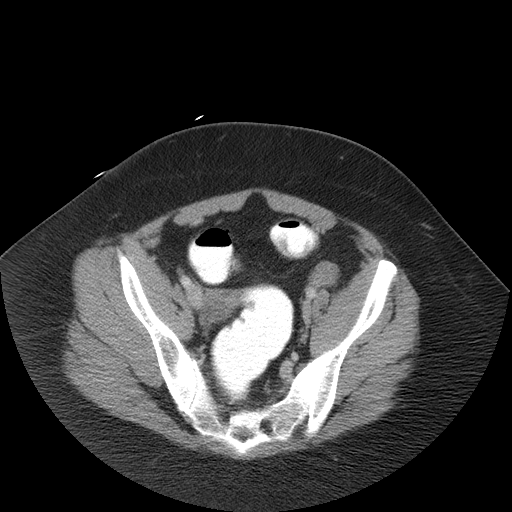
[im 22/41  soft-tissue]
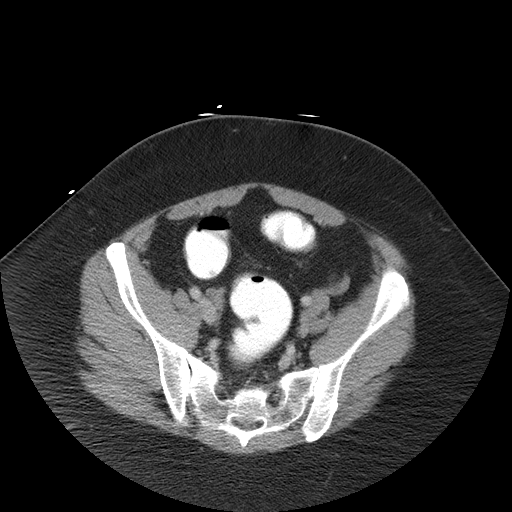
[im 25/41  soft-tissue]
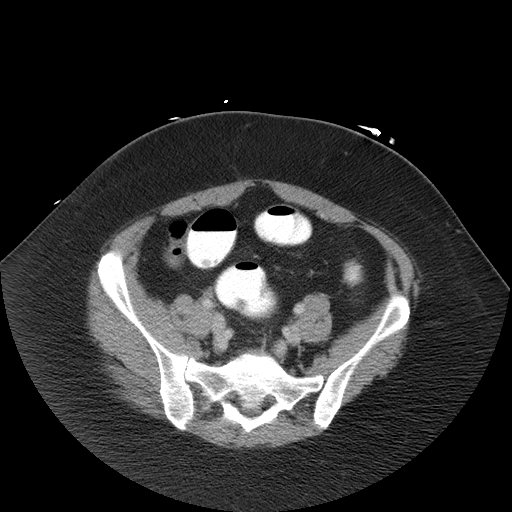
[im 25/41  bone]
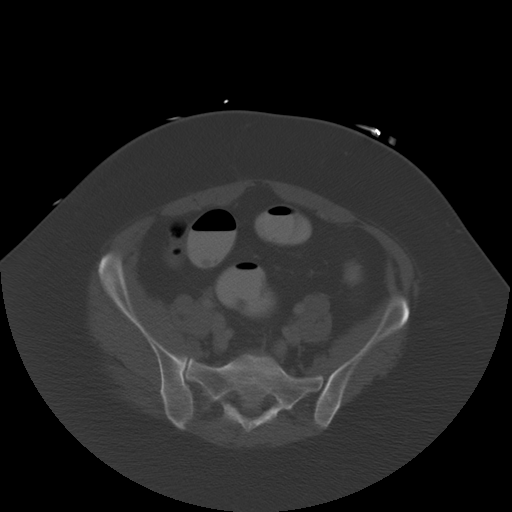
[im 28/41  soft-tissue]
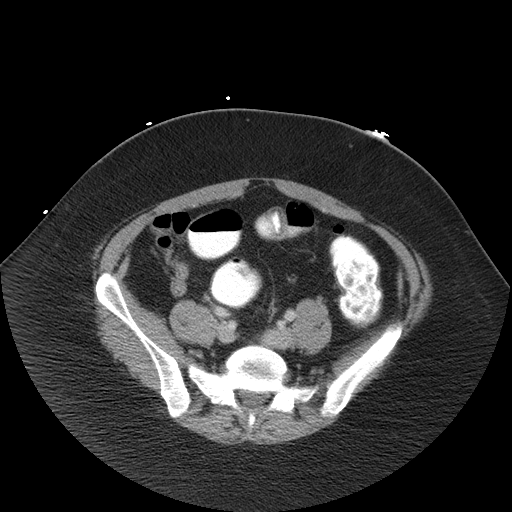
[im 30/41  soft-tissue]
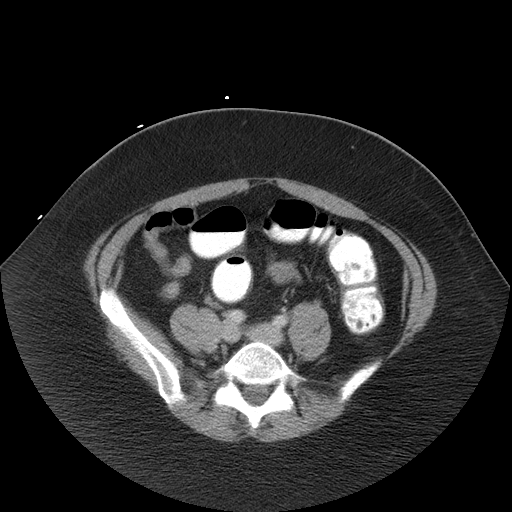
[im 33/41  soft-tissue]
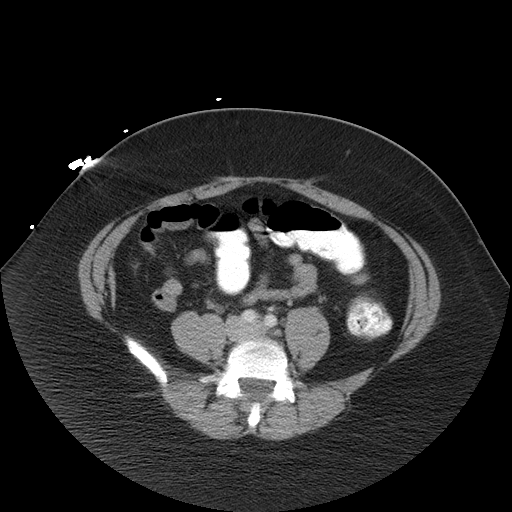
[im 35/41  soft-tissue]
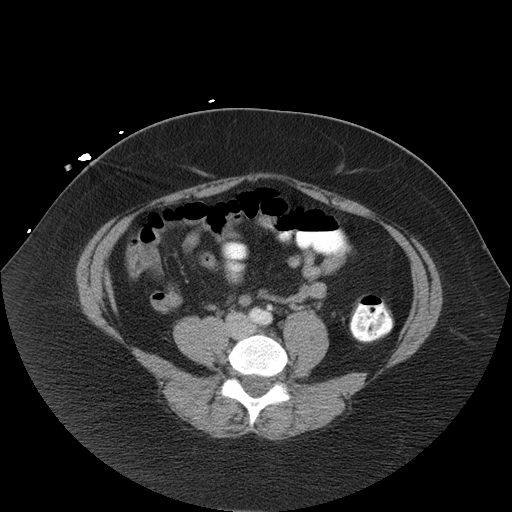
[im 38/41  soft-tissue]
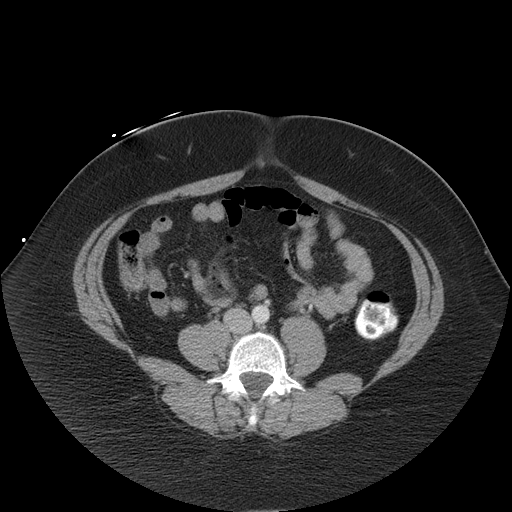

[Series 400: coronals · coronal · 0.78mm/px · 3 of 93 slices shown]
[im 31/93  soft-tissue]
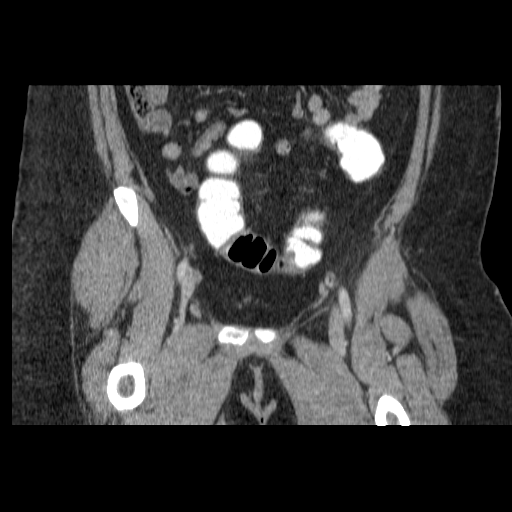
[im 41/93  soft-tissue]
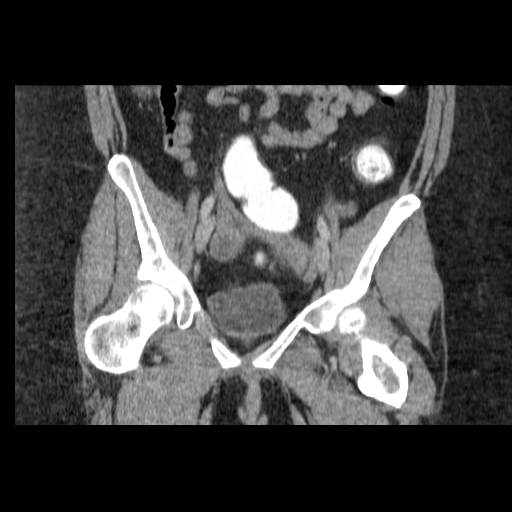
[im 52/93  soft-tissue]
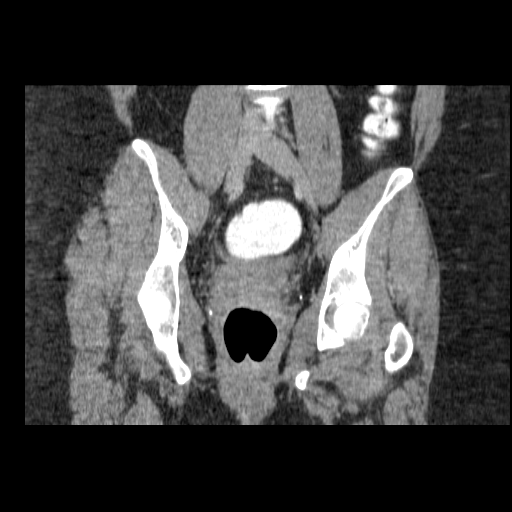

[17 of 46 positions shown; findings below may reference images not displayed]

FINDINGS: Mild concentric inferior rectal wall thickening.  On the
most inferior images, there is a partially included low density
mass-like area to the left and anterior to the inferior rectum,
measuring 3.0 x 2.6 cm one image number 54.

3.4 cm right ovarian cyst, not seen on the previous ultrasound.  No
enlarged lymph nodes.  Unremarkable bones.
IMPRESSION: 1.  Mild diffuse inferior rectal wall thickening with a probable
small left perirectal/perianal abscess, not included in its
entirety.  The included portion measures 3.0 cm in maximum
diameter.
2.  Interval 3.0 cm right ovarian cyst.

## 2018-03-18 ENCOUNTER — Encounter (HOSPITAL_COMMUNITY): Payer: Self-pay

## 2018-03-18 ENCOUNTER — Emergency Department (HOSPITAL_COMMUNITY)
Admission: EM | Admit: 2018-03-18 | Discharge: 2018-03-18 | Disposition: A | Discharge status: Home / Self Care | Payer: Medicaid Other | Attending: Emergency Medicine | Admitting: Emergency Medicine

## 2018-03-18 DIAGNOSIS — R55 Syncope and collapse: Secondary | ICD-10-CM | POA: Diagnosis not present

## 2018-03-18 DIAGNOSIS — R000 Tachycardia, unspecified: Secondary | ICD-10-CM | POA: Insufficient documentation

## 2018-03-18 DIAGNOSIS — R42 Dizziness and giddiness: Secondary | ICD-10-CM | POA: Diagnosis present

## 2018-03-18 DIAGNOSIS — Z79899 Other long term (current) drug therapy: Secondary | ICD-10-CM | POA: Insufficient documentation

## 2018-03-18 DIAGNOSIS — F17210 Nicotine dependence, cigarettes, uncomplicated: Secondary | ICD-10-CM | POA: Diagnosis not present

## 2018-03-18 LAB — URINALYSIS, ROUTINE W REFLEX MICROSCOPIC
GLUCOSE, UA: NEGATIVE mg/dL
KETONES UR: 5 mg/dL — AB
LEUKOCYTES UA: NEGATIVE
NITRITE: NEGATIVE
PH: 5 (ref 5.0–8.0)
Protein, ur: NEGATIVE mg/dL
SPECIFIC GRAVITY, URINE: 1.035 — AB (ref 1.005–1.030)

## 2018-03-18 LAB — I-STAT BETA HCG BLOOD, ED (MC, WL, AP ONLY): I-stat hCG, quantitative: <5 m[IU]/mL (ref <5)

## 2018-03-18 LAB — BASIC METABOLIC PANEL
Anion gap: 10 (ref 5–15)
BUN: 17 mg/dL (ref 6–20)
CALCIUM: 9.5 mg/dL (ref 8.9–10.3)
CHLORIDE: 105 mmol/L (ref 98–111)
CO2: 25 mmol/L (ref 22–32)
CREATININE: 0.75 mg/dL (ref 0.44–1.00)
Glucose, Bld: 102 mg/dL — ABNORMAL HIGH (ref 70–99)
Potassium: 4.2 mmol/L (ref 3.5–5.1)
SODIUM: 140 mmol/L (ref 135–145)

## 2018-03-18 LAB — CBC
HCT: 46.3 % — ABNORMAL HIGH (ref 36.0–46.0)
Hemoglobin: 15.2 g/dL — ABNORMAL HIGH (ref 12.0–15.0)
MCH: 30.2 pg (ref 26.0–34.0)
MCHC: 32.8 g/dL (ref 30.0–36.0)
MCV: 91.9 fL (ref 78.0–100.0)
PLATELETS: 270 10*3/uL (ref 150–400)
RBC: 5.04 MIL/uL (ref 3.87–5.11)
RDW: 13.2 % (ref 11.5–15.5)
WBC: 8.8 10*3/uL (ref 4.0–10.5)

## 2018-03-18 NOTE — ED Triage Notes (Signed)
Pt reports that at 430 she began to have dizziness like she was going to pass out, loss of hearing, which has resolved, pressure in head, jaw pain and palpations

## 2018-03-18 NOTE — Discharge Instructions (Addendum)
You were seen today for an episode of almost passing out and episodes of tachycardia.  Your work-up here is largely reassuring.  It sounds as though you may have had an episode of SVT.  You need to follow-up with cardiology for Holter monitoring.  Make sure to stay hydrated.  Will be given information regarding SVT.  If you have any new or worsening symptoms you should be reevaluated.

## 2018-03-18 NOTE — ED Provider Notes (Signed)
[facility] EMERGENCY DEPARTMENT Provider Note   CSN: [id number] Arrival date & time: 03/18/18  2105     History   Chief Complaint Chief Complaint  Patient presents with  . Dizziness    HPI Jessica Downs is a 37 y.o. female.  HPI  This is a 37 year old female who presents with an episode of dizziness and tachycardia.  Patient reports that she had been at the pool this afternoon with her children.  She left at approximately 430.  While walking to the car she had sudden onset of dizziness which she describes as lightheadedness.  She states that she felt like she was going to pass out.  During this time she feels like she had difficulty hearing and when she was talking she heard it in her head.  At that time, she sat in the car internal air conditioning.  She experienced pressure in the head and palpitations.  Her Fitbit would not register her heart rate.  She had an app on her phone which read her heart rate at 300.  She states that she was finally able to get her heart rate to come down and she settled into a slight tachycardia at 110-1 20.  She states that the episode lasted approximately 30 minutes.  She denied any chest pain or shortness of breath during this episode.  No family history of sudden cardiac death.  No known history of arrhythmia.  She denies any recent increase caffeine use.  She does report that she has started a keto diet but has not "followed it religiously."  She currently reports she is back to baseline.  Past Medical History:  Diagnosis Date  . Perirectal abscess   . Rectal pain     There are no active problems to display for this patient.   Past Surgical History:  Procedure Laterality Date  . INCISION AND DRAINAGE PERIRECTAL ABSCESS  01/20/2012   Procedure: IRRIGATION AND DEBRIDEMENT PERIRECTAL ABSCESS;  Surgeon: Atilano Ina, MD,FACS;  Location: [facility] OR;  Service: General;  Laterality: N/A;     OB History   None      Home  Medications    Prior to Admission medications   Medication Sig Start Date End Date Taking? Authorizing Provider  amphetamine-dextroamphetamine (ADDERALL) 10 MG tablet Take 10 mg by mouth daily. Takes in afternoon for total of 30 mg per day.    [provider]  amphetamine-dextroamphetamine (ADDERALL) 20 MG tablet Take 20 mg by mouth daily. Takes in Jessica for a total of 30 per day.    [provider]    Family History No family history on file.  Social History Social History   Tobacco Use  . Smoking status: Current Some Day Smoker    Packs/day: 0.50    Types: Cigarettes  . Smokeless tobacco: Never Used  Substance Use Topics  . Alcohol use: No  . Drug use: No     Allergies   Patient has no known allergies.   Review of Systems Review of Systems  Constitutional: Negative for fever.  Respiratory: Negative for shortness of breath.   Cardiovascular: Negative for chest pain.  Gastrointestinal: Positive for nausea. Negative for abdominal pain and vomiting.  Genitourinary: Negative for dysuria.  Neurological: Positive for dizziness, light-headedness and headaches.  All other systems reviewed and are negative.    Physical Exam Updated Vital Signs BP 116/83   Pulse 84   Temp 98.3 F (36.8 C) (Oral)   Resp 16  LMP 03/12/2018   SpO2 98%   Physical Exam  Constitutional: She is oriented to person, place, and time. She appears well-developed and well-nourished. No distress.  HENT:  Head: Normocephalic and atraumatic.  Eyes: Pupils are equal, round, and reactive to light.  Neck: Neck supple.  Cardiovascular: Normal rate, regular rhythm and normal heart sounds.  No murmur heard. Pulmonary/Chest: Effort normal and breath sounds normal. No respiratory distress. She has no wheezes.  Abdominal: Soft. There is no tenderness.  Musculoskeletal: She exhibits no edema.  Neurological: She is alert and oriented to person, place, and time.  Skin: Skin is warm and  dry.  Psychiatric: She has a normal mood and affect.  Nursing note and vitals reviewed.    ED Treatments / Results  Labs (all labs ordered are listed, but only abnormal results are displayed) Labs Reviewed  BASIC METABOLIC PANEL - Abnormal; Notable for the following components:      Result Value   Glucose, Bld 102 (*)    All other components within normal limits  CBC - Abnormal; Notable for the following components:   Hemoglobin 15.2 (*)    HCT 46.3 (*)    All other components within normal limits  URINALYSIS, ROUTINE W REFLEX MICROSCOPIC - Abnormal; Notable for the following components:   Specific Gravity, Urine 1.035 (*)    Hgb urine dipstick MODERATE (*)    Bilirubin Urine SMALL (*)    Ketones, ur 5 (*)    Bacteria, UA RARE (*)    All other components within normal limits  I-STAT BETA HCG BLOOD, ED ([facility], [facility], [facility] ONLY)    EKG EKG Interpretation  Date/Time:  Monday March 18 2018 21:23:56 EDT Ventricular Rate:  86 PR Interval:  134 QRS Duration: 80 QT Interval:  362 QTC Calculation: 433 R Axis:   39 Text Interpretation:  Normal sinus rhythm with sinus arrhythmia Normal ECG Confirmed by Ross Marcus ([id number]) on 03/18/2018 11:07:16 PM   Radiology No results found.  Procedures Procedures (including critical care time)  Medications Ordered in ED Medications - No data to display   Initial Impression / Assessment and Plan / ED Course  I have reviewed the triage vital signs and the nursing notes.  Pertinent labs & imaging results that were available during my care of the patient were reviewed by me and considered in my medical decision making (see chart for details).     Patient presents with episode of dizziness and near syncope.  Also reports significant tachycardia.  Here her vital signs are reassuring and her EKG shows no evidence of arrhythmia.  Her history is suspicious for possible episode of SVT or other tachyarrhythmia that self resolved.  She has no red  flags.  No significant family history.  She is back to her baseline.  I do believe she likely needs cardiology follow-up and Holter monitoring.  Suspect her symptoms were all related to her tachycardia.  I had a long discussion with the patient about what I believed happened earlier today.  She is comfortable with follow-up.  She has not had any recurrent episodes since late this afternoon.  After history, exam, and medical workup I feel the patient has been appropriately medically screened and is safe for discharge home. Pertinent diagnoses were discussed with the patient. Patient was given return precautions.   Final Clinical Impressions(s) / ED Diagnoses   Final diagnoses:  Syncope, near  Tachycardia    ED Discharge Orders    None  Shon BatonHorton, Courtney F, MD 03/18/18 2340

## 2019-01-28 ENCOUNTER — Other Ambulatory Visit: Payer: Self-pay | Admitting: *Deleted

## 2019-01-28 DIAGNOSIS — Z20822 Contact with and (suspected) exposure to covid-19: Secondary | ICD-10-CM

## 2019-01-30 ENCOUNTER — Telehealth: Payer: Self-pay | Admitting: Family Medicine

## 2019-01-30 LAB — NOVEL CORONAVIRUS, NAA: SARS-CoV-2, NAA: NOT DETECTED

## 2019-01-30 NOTE — Telephone Encounter (Signed)
Pt. Returned call for lab results.  Advised that COVID 19 test was negative.  Verb. Understanding.  Will fax results to pt's PCP.

## 2019-01-31 NOTE — Telephone Encounter (Signed)
Faxed COVID results to Dr. Bernerd Limbo @ [phone].

## 2020-03-07 ENCOUNTER — Encounter (HOSPITAL_BASED_OUTPATIENT_CLINIC_OR_DEPARTMENT_OTHER): Payer: Self-pay | Admitting: Emergency Medicine

## 2020-03-07 ENCOUNTER — Ambulatory Visit
Admission: EM | Admit: 2020-03-07 | Discharge: 2020-03-07 | Disposition: A | Discharge status: Home / Self Care | Payer: Medicaid Other | Attending: Emergency Medicine | Admitting: Emergency Medicine

## 2020-03-07 ENCOUNTER — Emergency Department (HOSPITAL_BASED_OUTPATIENT_CLINIC_OR_DEPARTMENT_OTHER): Payer: Medicaid Other

## 2020-03-07 ENCOUNTER — Emergency Department (HOSPITAL_BASED_OUTPATIENT_CLINIC_OR_DEPARTMENT_OTHER)
Admission: EM | Admit: 2020-03-07 | Discharge: 2020-03-07 | Disposition: A | Discharge status: Home / Self Care | Payer: Medicaid Other | Attending: Emergency Medicine | Admitting: Emergency Medicine

## 2020-03-07 ENCOUNTER — Encounter: Payer: Self-pay | Admitting: Emergency Medicine

## 2020-03-07 ENCOUNTER — Other Ambulatory Visit: Payer: Self-pay

## 2020-03-07 DIAGNOSIS — R1084 Generalized abdominal pain: Secondary | ICD-10-CM | POA: Diagnosis not present

## 2020-03-07 DIAGNOSIS — K5792 Diverticulitis of intestine, part unspecified, without perforation or abscess without bleeding: Secondary | ICD-10-CM

## 2020-03-07 DIAGNOSIS — F17210 Nicotine dependence, cigarettes, uncomplicated: Secondary | ICD-10-CM | POA: Insufficient documentation

## 2020-03-07 DIAGNOSIS — R1032 Left lower quadrant pain: Secondary | ICD-10-CM | POA: Diagnosis present

## 2020-03-07 LAB — COMPREHENSIVE METABOLIC PANEL
ALT: 37 U/L (ref 0–44)
AST: 23 U/L (ref 15–41)
Albumin: 4.1 g/dL (ref 3.5–5.0)
Alkaline Phosphatase: 100 U/L (ref 38–126)
Anion gap: 12 (ref 5–15)
BUN: 10 mg/dL (ref 6–20)
CO2: 23 mmol/L (ref 22–32)
Calcium: 8.8 mg/dL — ABNORMAL LOW (ref 8.9–10.3)
Chloride: 102 mmol/L (ref 98–111)
Creatinine, Ser: 0.58 mg/dL (ref 0.44–1.00)
GFR calc Af Amer: >60 mL/min (ref >60)
GFR calc non Af Amer: >60 mL/min (ref >60)
Glucose, Bld: 98 mg/dL (ref 70–99)
Potassium: 3.8 mmol/L (ref 3.5–5.1)
Sodium: 137 mmol/L (ref 135–145)
Total Bilirubin: 0.2 mg/dL — ABNORMAL LOW (ref 0.3–1.2)
Total Protein: 7.1 g/dL (ref 6.5–8.1)

## 2020-03-07 LAB — POCT URINE PREGNANCY: Preg Test, Ur: NEGATIVE

## 2020-03-07 LAB — CBC WITH DIFFERENTIAL/PLATELET
Abs Immature Granulocytes: 0.07 10*3/uL (ref 0.00–0.07)
Basophils Absolute: 0.1 10*3/uL (ref 0.0–0.1)
Basophils Relative: 1 %
Eosinophils Absolute: 0.1 10*3/uL (ref 0.0–0.5)
Eosinophils Relative: 1 %
HCT: 43.9 % (ref 36.0–46.0)
Hemoglobin: 14.7 g/dL (ref 12.0–15.0)
Immature Granulocytes: 1 %
Lymphocytes Relative: 20 %
Lymphs Abs: 2.8 10*3/uL (ref 0.7–4.0)
MCH: 30.4 pg (ref 26.0–34.0)
MCHC: 33.5 g/dL (ref 30.0–36.0)
MCV: 90.9 fL (ref 80.0–100.0)
Monocytes Absolute: 0.9 10*3/uL (ref 0.1–1.0)
Monocytes Relative: 6 %
Neutro Abs: 10.2 10*3/uL — ABNORMAL HIGH (ref 1.7–7.7)
Neutrophils Relative %: 71 %
Platelets: 278 10*3/uL (ref 150–400)
RBC: 4.83 MIL/uL (ref 3.87–5.11)
RDW: 12.9 % (ref 11.5–15.5)
WBC: 14.2 10*3/uL — ABNORMAL HIGH (ref 4.0–10.5)
nRBC: 0 % (ref 0.0–0.2)

## 2020-03-07 LAB — POCT URINALYSIS DIP (MANUAL ENTRY)
Bilirubin, UA: NEGATIVE
Glucose, UA: NEGATIVE mg/dL
Ketones, POC UA: NEGATIVE mg/dL
Leukocytes, UA: NEGATIVE
Nitrite, UA: NEGATIVE
Protein Ur, POC: NEGATIVE mg/dL
Spec Grav, UA: 1.02 (ref 1.010–1.025)
Urobilinogen, UA: 0.2 E.U./dL
pH, UA: 6 (ref 5.0–8.0)

## 2020-03-07 LAB — LIPASE, BLOOD: Lipase: 21 U/L (ref 11–51)

## 2020-03-07 MED ORDER — IOHEXOL 300 MG/ML  SOLN
100.0000 mL | Freq: Once | INTRAMUSCULAR | Status: AC | PRN
  Administered 2020-03-07: 100 mL via INTRAVENOUS

## 2020-03-07 MED ORDER — ACETAMINOPHEN ER 650 MG PO TBCR: 650.0000 mg | EXTENDED_RELEASE_TABLET | Freq: Three times a day (TID) | ORAL | 0 refills | Status: DC | PRN

## 2020-03-07 MED ORDER — AMOXICILLIN-POT CLAVULANATE 875-125 MG PO TABS: 1.0000 | ORAL_TABLET | Freq: Two times a day (BID) | ORAL | 0 refills | Status: DC

## 2020-03-07 MED ORDER — ACETAMINOPHEN ER 650 MG PO TBCR: 650.0000 mg | EXTENDED_RELEASE_TABLET | Freq: Three times a day (TID) | ORAL | 0 refills | Status: AC | PRN

## 2020-03-07 MED ORDER — AMOXICILLIN-POT CLAVULANATE 875-125 MG PO TABS
1.0000 | ORAL_TABLET | Freq: Once | ORAL | Status: AC
  Administered 2020-03-07: 1 via ORAL
  Filled 2020-03-07: qty 1

## 2020-03-07 MED ORDER — AMOXICILLIN-POT CLAVULANATE 875-125 MG PO TABS: 1.0000 | ORAL_TABLET | Freq: Two times a day (BID) | ORAL | 0 refills | Status: AC

## 2020-03-07 MED ORDER — ACETAMINOPHEN 325 MG PO TABS
650.0000 mg | ORAL_TABLET | Freq: Once | ORAL | Status: AC
  Administered 2020-03-07: 650 mg via ORAL
  Filled 2020-03-07: qty 2

## 2020-03-07 NOTE — Discharge Instructions (Signed)
As discussed, your CT scan showed diverticulitis.  I am sending home with an antibiotic.  Take twice a day for 10 days.  Be sure to finish all antibiotics. I have included the number of a GI doctor. Call tomorrow to schedule an appointment for further evaluation. Return to the ER for new or worsening symptoms.

## 2020-03-07 NOTE — ED Triage Notes (Signed)
Pt c/o of lower abd pain starting Friday. Pt was treated for UTI last week with antibiotics finishing last Thursday. Pain is constant in LLQ as sharp, shooting pain. Denies vomiting, diarrhea and fever.

## 2020-03-07 NOTE — ED Provider Notes (Signed)
[facility]    CSN: [id number] Arrival date & time: 03/07/20  1515      History   Chief Complaint Chief Complaint  Patient presents with  . Abdominal Pain    HPI DESHON KOSLOWSKI is a 39 y.o. female with history of perirectal abscess presenting for lower abdominal pain since Friday.  States it has become more generalized.  Finished antibiotics for UTI last week: No urinary symptoms at this time.  Denying pelvic or vaginal pain, discharge, change in bowel habit, vomiting, fever.  Denies history of abdominal pelvic surgery.  Has taken Tylenol without relief.   Past Medical History:  Diagnosis Date  . Perirectal abscess   . Rectal pain     There are no problems to display for this patient.   Past Surgical History:  Procedure Laterality Date  . INCISION AND DRAINAGE PERIRECTAL ABSCESS  01/20/2012   Procedure: IRRIGATION AND DEBRIDEMENT PERIRECTAL ABSCESS;  Surgeon: Atilano Ina, MD,FACS;  Location: [facility] OR;  Service: General;  Laterality: N/A;  . JOINT REPLACEMENT Bilateral    thumb    OB History   No obstetric history on file.      Home Medications    Prior to Admission medications   Medication Sig Start Date End Date Taking? Authorizing Provider  amphetamine-dextroamphetamine (ADDERALL) 20 MG tablet Take 30 mg by mouth daily. Takes in morning for a total of 30 per day.    Yes [provider]    Family History History reviewed. No pertinent family history.  Social History Social History   Tobacco Use  . Smoking status: Current Some Day Smoker    Packs/day: 1.00    Types: Cigarettes  . Smokeless tobacco: Never Used  Vaping Use  . Vaping Use: Never used  Substance Use Topics  . Alcohol use: No  . Drug use: No     Allergies   Patient has no known allergies.   Review of Systems As per HPI   Physical Exam Triage Vital Signs ED Triage Vitals  Enc Vitals Group     BP      Pulse      Resp      Temp      Temp src      SpO2       Weight      Height      Head Circumference      Peak Flow      Pain Score      Pain Loc      Pain Edu?      Excl. in GC?    No data found.  Updated Vital Signs BP 130/80 (BP Location: Left Arm)   Pulse (!) 106   Temp 98.2 F (36.8 C) (Oral)   Resp 16   LMP 02/21/2020 (Exact Date)   SpO2 97%   Visual Acuity Right Eye Distance:   Left Eye Distance:   Bilateral Distance:    Right Eye Near:   Left Eye Near:    Bilateral Near:     Physical Exam Constitutional:      General: She is not in acute distress. HENT:     Head: Normocephalic and atraumatic.  Eyes:     General: No scleral icterus.    Pupils: Pupils are equal, round, and reactive to light.  Cardiovascular:     Rate and Rhythm: Normal rate and regular rhythm.  Pulmonary:     Effort: Pulmonary effort is normal.  Abdominal:  General: Abdomen is flat. Bowel sounds are normal. There is no abdominal bruit.     Palpations: Abdomen is soft. There is no hepatomegaly or splenomegaly.     Tenderness: There is generalized abdominal tenderness. Negative signs include Murphy's sign, Rovsing's sign and McBurney's sign.     Hernia: No hernia is present.     Comments: Patient reporting severe, generalized abdominal pain.  Overall exam limited second patient cooperation due to pain.  Skin:    Coloration: Skin is not jaundiced or pale.  Neurological:     Mental Status: She is alert and oriented to person, place, and time.      UC Treatments / Results  Labs (all labs ordered are listed, but only abnormal results are displayed) Labs Reviewed  POCT URINALYSIS DIP (MANUAL ENTRY) - Abnormal; Notable for the following components:      Result Value   Blood, UA moderate (*)    All other components within normal limits  POCT URINE PREGNANCY - Normal    EKG   Radiology No results found.  Procedures Procedures (including critical care time)  Medications Ordered in UC Medications - No data to display  Initial  Impression / Assessment and Plan / UC Course  I have reviewed the triage vital signs and the nursing notes.  Pertinent labs & imaging results that were available during my care of the patient were reviewed by me and considered in my medical decision making (see chart for details).     Patient febrile, nontoxic in office today.  Endorsing severe, generalized abdominal pain.  Declined Toradol injection in office today.  Urine pregnancy negative, urine dipstick unremarkable.  Patient electing to go to ER for further evaluation, to which I am agreeable.  Electing to self transport in stable condition and personal vehicle.  Return precautions discussed, pt verbalized understanding and is agreeable to plan. Final Clinical Impressions(s) / UC Diagnoses   Final diagnoses:  Generalized abdominal pain   Discharge Instructions   None    ED Prescriptions    None     PDMP not reviewed this encounter.   Hall[place]Potvin, [place], [place] 03/07/20 1604

## 2020-03-07 NOTE — ED Provider Notes (Signed)
[facility] EMERGENCY DEPARTMENT Provider Note   CSN: [id number] Arrival date & time: 03/07/20  1639     History Chief Complaint  Patient presents with  . Abdominal Pain    Jessica Downs is a 39 y.o. female with no significant past medical history who presents the ED due to left lower quadrant abdominal pain that radiates to the left low back that has been persistent since Friday.  Patient states she began having lower abdominal pain starting on 02/21/2020 where she was evaluated by her OB/GYN on 7/9 and diagnosed with a urinary tract infection and yeast infection which she was treated for.  Patient notes her abdominal pain completely resolved; however, returned on Friday and more localized to the left lower quadrant.  Patient was evaluated at urgent care prior to arrival and sent to the ED for further evaluation.  Abdominal pain associated with nausea.  Patient denies vomiting, diarrhea, fever, and chills.  Patient denies vaginal symptoms.  No concern for STDs at this time.  Patient denies overlying rash in left flank region.  She admits to mild pressure with urination.  No previous abdominal operations.  Denies history of kidney stones.  Denies hematuria.  She has been taking Tylenol with no relief.  No aggravating or alleviating factors.  History obtained from patient and past medical records. No interpreter used during encounter.      Past Medical History:  Diagnosis Date  . Perirectal abscess   . Rectal pain     There are no problems to display for this patient.   Past Surgical History:  Procedure Laterality Date  . INCISION AND DRAINAGE PERIRECTAL ABSCESS  01/20/2012   Procedure: IRRIGATION AND DEBRIDEMENT PERIRECTAL ABSCESS;  Surgeon: Atilano Ina, MD,FACS;  Location: [facility] OR;  Service: General;  Laterality: N/A;  . JOINT REPLACEMENT Bilateral    thumb     OB History   No obstetric history on file.     No family history on file.  Social History   Tobacco Use   . Smoking status: Current Some Day Smoker    Packs/day: 1.00    Types: Cigarettes  . Smokeless tobacco: Never Used  Vaping Use  . Vaping Use: Never used  Substance Use Topics  . Alcohol use: No  . Drug use: No    Home Medications Prior to Admission medications   Medication Sig Start Date End Date Taking? Authorizing Provider  amoxicillin-clavulanate (AUGMENTIN) 875-125 MG tablet Take 1 tablet by mouth every 12 (twelve) hours for 10 days. 03/07/20 03/17/20  Mannie Stabile, PA-C  amphetamine-dextroamphetamine (ADDERALL) 20 MG tablet Take 30 mg by mouth daily. Takes in morning for a total of 30 per day.     [provider]    Allergies    Patient has no known allergies.  Review of Systems   Review of Systems  Constitutional: Negative for chills and fever.  Gastrointestinal: Positive for abdominal pain and nausea. Negative for diarrhea and vomiting.  Genitourinary: Negative for dysuria, hematuria and vaginal discharge.  Musculoskeletal: Positive for back pain.  All other systems reviewed and are negative.   Physical Exam Updated Vital Signs BP 107/80 (BP Location: Right Arm)   Pulse 79   Temp 98.1 F (36.7 C) (Oral)   Resp 14   Ht 5\' 3"  (1.6 m)   Wt 83.9 kg   LMP 02/21/2020 (Exact Date)   SpO2 99%   BMI 32.77 kg/m   Physical Exam Vitals and nursing note reviewed.  Constitutional:      General: She is not in acute distress.    Appearance: She is not ill-appearing.  HENT:     Head: Normocephalic.  Eyes:     Pupils: Pupils are equal, round, and reactive to light.  Cardiovascular:     Rate and Rhythm: Normal rate and regular rhythm.     Pulses: Normal pulses.     Heart sounds: Normal heart sounds. No murmur heard.  No friction rub. No gallop.   Pulmonary:     Effort: Pulmonary effort is normal.     Breath sounds: Normal breath sounds.  Abdominal:     General: Abdomen is flat. Bowel sounds are normal. There is no distension.     Palpations: Abdomen  is soft.     Tenderness: There is abdominal tenderness. There is no right CVA tenderness, left CVA tenderness, guarding or rebound.     Comments: Tenderness palpation in right lower quadrant and left lower quadrant.  No rebound or guarding.  Negative CVA tenderness bilaterally.  Musculoskeletal:     Cervical back: Neck supple.     Comments: Able to move all 4 extremities without difficulty.  Skin:    General: Skin is warm and dry.     Comments: No overlying rash in left flank region.  Neurological:     General: No focal deficit present.     Mental Status: She is alert.  Psychiatric:        Mood and Affect: Mood normal.        Behavior: Behavior normal.     ED Results / Procedures / Treatments   Labs (all labs ordered are listed, but only abnormal results are displayed) Labs Reviewed  CBC WITH DIFFERENTIAL/PLATELET - Abnormal; Notable for the following components:      Result Value   WBC 14.2 (*)    Neutro Abs 10.2 (*)    All other components within normal limits  COMPREHENSIVE METABOLIC PANEL - Abnormal; Notable for the following components:   Calcium 8.8 (*)    Total Bilirubin 0.2 (*)    All other components within normal limits  LIPASE, BLOOD    EKG None  Radiology CT ABDOMEN PELVIS W CONTRAST  Result Date: 03/07/2020 CLINICAL DATA:  Left lower quadrant abdominal pain. EXAM: CT ABDOMEN AND PELVIS WITH CONTRAST TECHNIQUE: Multidetector CT imaging of the abdomen and pelvis was performed using the standard protocol following bolus administration of intravenous contrast. CONTRAST:  OMNIPAQUE IOHEXOL 300 MG/ML  SOLN COMPARISON:  None. FINDINGS: Lower chest: No acute abnormality. Hepatobiliary: No focal liver abnormality is seen. No gallstones, gallbladder wall thickening, or biliary dilatation. Pancreas: Unremarkable. No pancreatic ductal dilatation or surrounding inflammatory changes. Spleen: Normal in size without focal abnormality. Adrenals/Urinary Tract: Adrenal glands  are unremarkable. Kidneys are normal, without renal calculi, focal lesion, or hydronephrosis. Bladder is unremarkable. Stomach/Bowel: Stomach is within normal limits. Appendix appears normal. No evidence of bowel dilatation. A mildly inflamed diverticulum is seen within the proximal sigmoid colon. There is no evidence of associated perforation or abscess. Vascular/Lymphatic: No significant vascular findings are present. No enlarged abdominal or pelvic lymph nodes. Reproductive: An IUD is seen within an otherwise normal appearing uterus. Other: No abdominal wall hernia or abnormality. No abdominopelvic ascites. Musculoskeletal: No acute or significant osseous findings. IMPRESSION: Mild sigmoid diverticulitis without evidence of associated perforation or abscess. Electronically Signed   By: Aram Candela M.D.   On: 03/07/2020 20:04    Procedures Procedures (including critical care time)  Medications Ordered in ED Medications  acetaminophen (TYLENOL) tablet 650 mg (has no administration in time range)  iohexol (OMNIPAQUE) 300 MG/ML solution 100 mL (100 mLs Intravenous Contrast Given 03/07/20 1754)    ED Course  I have reviewed the triage vital signs and the nursing notes.  Pertinent labs & imaging results that were available during my care of the patient were reviewed by me and considered in my medical decision making (see chart for details).  Clinical Course as of Mar 07 2108  Wynelle Link Mar 07, 2020  1707 WBC(!): 14.2 [CA]    Clinical Course User Index [CA] Jesusita Oka   MDM Rules/Calculators/A&P                         39 year old female presents to the ED due to left lower quadrant abdominal pain that started on Friday.  Patient has been having ongoing abdominal pain since 7/3 and was diagnosed with a UTI and yeast infection on 7/9 by her OB/GYN.  After treatment, pain resolved; however, she notes her abdominal pain returned on Friday and became localized to her left lower  quadrant.  Patient was evaluated at urgent care prior to arrival where a UA and urine pregnancy test was obtained.  UA significant for moderate hematuria, but no signs of infection.  Urine pregnancy test negative.  Upon arrival, vitals all within normal limits.  Patient is afebrile, not tachycardic or hypoxic.  Patient in no acute distress and nontoxic-appearing.  Physical exam significant for right lower and left lower quadrant abdominal tenderness.  No rebound or guarding.  Negative CVA tenderness bilaterally.  Doubt pyelonephritis given noninfectious UA and no CVA tenderness.  Suspect symptoms related to possible kidney stone given hematuria, diverticulitis, or appendicitis.  Will obtain routine labs rule out electrolyte abnormalities and signs of infection.  Also obtain CT abdomen.  Patient deferred pain medication at this time.  CBC significant for leukocytosis at 14.2, but otherwise reassuring.  Lipase normal at 21.  Doubt pancreatitis.  CMP reassuring with normal renal function no major electrolyte derangements. CT abdomen personally reviewed which demonstrates: IMPRESSION:  Mild sigmoid diverticulitis without evidence of associated  perforation or abscess.   Patient able to tolerate p.o. here without difficulty.  Patient discharged with Augmentin.  Tylenol for pain management.  GI number given to patient at discharge.  Instructed patient to call tomorrow to schedule plan for further evaluation. Strict ED precautions discussed with patient. Patient states understanding and agrees to plan. Patient discharged home in no acute distress and stable vitals. Final Clinical Impression(s) / ED Diagnoses Final diagnoses:  Diverticulitis    Rx / DC Orders ED Discharge Orders         Ordered    amoxicillin-clavulanate (AUGMENTIN) 875-125 MG tablet  Every 12 hours     Discontinue  Reprint     03/07/20 2019           Jesusita Oka 03/07/20 2111    Gwyneth Sprout, MD 03/08/20  2122

## 2020-03-07 NOTE — ED Triage Notes (Signed)
Pt c/o left sided abdominal pain that returned on Friday. Symptoms originally began on 02/21/2020.

## 2021-04-07 DIAGNOSIS — E785 Hyperlipidemia, unspecified: Secondary | ICD-10-CM | POA: Diagnosis not present

## 2021-04-07 DIAGNOSIS — Z23 Encounter for immunization: Secondary | ICD-10-CM | POA: Diagnosis not present

## 2021-04-07 DIAGNOSIS — Z1231 Encounter for screening mammogram for malignant neoplasm of breast: Secondary | ICD-10-CM | POA: Diagnosis not present

## 2021-04-07 DIAGNOSIS — M778 Other enthesopathies, not elsewhere classified: Secondary | ICD-10-CM | POA: Diagnosis not present

## 2021-04-07 DIAGNOSIS — Z0000 Encounter for general adult medical examination without abnormal findings: Secondary | ICD-10-CM | POA: Diagnosis not present

## 2021-07-07 DIAGNOSIS — Z1231 Encounter for screening mammogram for malignant neoplasm of breast: Secondary | ICD-10-CM | POA: Diagnosis not present

## 2021-07-07 DIAGNOSIS — F988 Other specified behavioral and emotional disorders with onset usually occurring in childhood and adolescence: Secondary | ICD-10-CM | POA: Diagnosis not present

## 2021-08-10 DIAGNOSIS — M7989 Other specified soft tissue disorders: Secondary | ICD-10-CM | POA: Diagnosis not present

## 2021-08-10 DIAGNOSIS — Z23 Encounter for immunization: Secondary | ICD-10-CM | POA: Diagnosis not present

## 2021-08-12 DIAGNOSIS — M7989 Other specified soft tissue disorders: Secondary | ICD-10-CM | POA: Diagnosis not present

## 2021-08-12 DIAGNOSIS — R600 Localized edema: Secondary | ICD-10-CM | POA: Diagnosis not present

## 2021-10-21 DIAGNOSIS — Z1231 Encounter for screening mammogram for malignant neoplasm of breast: Secondary | ICD-10-CM | POA: Diagnosis not present

## 2021-12-26 DIAGNOSIS — F988 Other specified behavioral and emotional disorders with onset usually occurring in childhood and adolescence: Secondary | ICD-10-CM | POA: Diagnosis not present

## 2022-03-28 DIAGNOSIS — E785 Hyperlipidemia, unspecified: Secondary | ICD-10-CM | POA: Diagnosis not present

## 2022-03-28 DIAGNOSIS — Z0000 Encounter for general adult medical examination without abnormal findings: Secondary | ICD-10-CM | POA: Diagnosis not present

## 2022-03-28 DIAGNOSIS — E669 Obesity, unspecified: Secondary | ICD-10-CM | POA: Diagnosis not present

## 2022-03-28 DIAGNOSIS — R609 Edema, unspecified: Secondary | ICD-10-CM | POA: Diagnosis not present

## 2022-06-28 DIAGNOSIS — F988 Other specified behavioral and emotional disorders with onset usually occurring in childhood and adolescence: Secondary | ICD-10-CM | POA: Diagnosis not present

## 2022-06-28 DIAGNOSIS — Z23 Encounter for immunization: Secondary | ICD-10-CM | POA: Diagnosis not present

## 2022-06-28 DIAGNOSIS — J302 Other seasonal allergic rhinitis: Secondary | ICD-10-CM | POA: Diagnosis not present

## 2022-08-07 DIAGNOSIS — U071 COVID-19: Secondary | ICD-10-CM | POA: Diagnosis not present

## 2022-09-28 DIAGNOSIS — F411 Generalized anxiety disorder: Secondary | ICD-10-CM | POA: Diagnosis not present

## 2022-09-28 DIAGNOSIS — L0231 Cutaneous abscess of buttock: Secondary | ICD-10-CM | POA: Diagnosis not present

## 2022-09-28 DIAGNOSIS — L03317 Cellulitis of buttock: Secondary | ICD-10-CM | POA: Diagnosis not present

## 2022-09-28 DIAGNOSIS — F988 Other specified behavioral and emotional disorders with onset usually occurring in childhood and adolescence: Secondary | ICD-10-CM | POA: Diagnosis not present

## 2022-10-24 DIAGNOSIS — L02219 Cutaneous abscess of trunk, unspecified: Secondary | ICD-10-CM | POA: Diagnosis not present

## 2022-10-24 DIAGNOSIS — L732 Hidradenitis suppurativa: Secondary | ICD-10-CM | POA: Diagnosis not present

## 2022-10-24 DIAGNOSIS — L0231 Cutaneous abscess of buttock: Secondary | ICD-10-CM | POA: Diagnosis not present

## 2022-10-30 DIAGNOSIS — E6609 Other obesity due to excess calories: Secondary | ICD-10-CM | POA: Diagnosis not present

## 2022-10-30 DIAGNOSIS — Z6835 Body mass index (BMI) 35.0-35.9, adult: Secondary | ICD-10-CM | POA: Diagnosis not present

## 2022-10-30 DIAGNOSIS — F17200 Nicotine dependence, unspecified, uncomplicated: Secondary | ICD-10-CM | POA: Diagnosis not present

## 2022-10-30 DIAGNOSIS — L732 Hidradenitis suppurativa: Secondary | ICD-10-CM | POA: Diagnosis not present

## 2022-12-05 DIAGNOSIS — Z1231 Encounter for screening mammogram for malignant neoplasm of breast: Secondary | ICD-10-CM | POA: Diagnosis not present

## 2022-12-05 DIAGNOSIS — R92323 Mammographic fibroglandular density, bilateral breasts: Secondary | ICD-10-CM | POA: Diagnosis not present

## 2022-12-27 DIAGNOSIS — F988 Other specified behavioral and emotional disorders with onset usually occurring in childhood and adolescence: Secondary | ICD-10-CM | POA: Diagnosis not present

## 2023-04-18 DIAGNOSIS — R600 Localized edema: Secondary | ICD-10-CM | POA: Diagnosis not present

## 2023-04-18 DIAGNOSIS — F988 Other specified behavioral and emotional disorders with onset usually occurring in childhood and adolescence: Secondary | ICD-10-CM | POA: Diagnosis not present

## 2023-04-18 DIAGNOSIS — Z0000 Encounter for general adult medical examination without abnormal findings: Secondary | ICD-10-CM | POA: Diagnosis not present

## 2023-04-18 DIAGNOSIS — L732 Hidradenitis suppurativa: Secondary | ICD-10-CM | POA: Diagnosis not present

## 2023-07-17 DIAGNOSIS — F988 Other specified behavioral and emotional disorders with onset usually occurring in childhood and adolescence: Secondary | ICD-10-CM | POA: Diagnosis not present

## 2023-12-11 DIAGNOSIS — J329 Chronic sinusitis, unspecified: Secondary | ICD-10-CM | POA: Diagnosis not present

## 2023-12-11 DIAGNOSIS — F988 Other specified behavioral and emotional disorders with onset usually occurring in childhood and adolescence: Secondary | ICD-10-CM | POA: Diagnosis not present

## 2024-01-15 DIAGNOSIS — F988 Other specified behavioral and emotional disorders with onset usually occurring in childhood and adolescence: Secondary | ICD-10-CM | POA: Diagnosis not present

## 2024-01-15 DIAGNOSIS — J302 Other seasonal allergic rhinitis: Secondary | ICD-10-CM | POA: Diagnosis not present

## 2024-01-15 DIAGNOSIS — Z1231 Encounter for screening mammogram for malignant neoplasm of breast: Secondary | ICD-10-CM | POA: Diagnosis not present

## 2024-01-17 DIAGNOSIS — R92323 Mammographic fibroglandular density, bilateral breasts: Secondary | ICD-10-CM | POA: Diagnosis not present

## 2024-01-17 DIAGNOSIS — Z1231 Encounter for screening mammogram for malignant neoplasm of breast: Secondary | ICD-10-CM | POA: Diagnosis not present

## 2024-07-15 DIAGNOSIS — R21 Rash and other nonspecific skin eruption: Secondary | ICD-10-CM | POA: Diagnosis not present

## 2024-07-15 DIAGNOSIS — F988 Other specified behavioral and emotional disorders with onset usually occurring in childhood and adolescence: Secondary | ICD-10-CM | POA: Diagnosis not present
